# Patient Record
Sex: Female | Born: 1980 | Race: White | Hispanic: No | Marital: Married | Smoking: Never smoker
Health system: Southern US, Community
[De-identification: ages and names within clinical notes are randomized; demographics above are authoritative.]

## PROBLEM LIST (undated history)

## (undated) DIAGNOSIS — Z1509 Genetic susceptibility to other malignant neoplasm: Secondary | ICD-10-CM

## (undated) HISTORY — PX: BLADDER SURGERY: SHX569

## (undated) HISTORY — PX: ABDOMINAL HYSTERECTOMY: SHX81

## (undated) HISTORY — DX: Genetic susceptibility to other malignant neoplasm: Z15.09

---
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
## 2009-02-12 ENCOUNTER — Ambulatory Visit: Payer: Self-pay | Admitting: Radiology

## 2009-02-12 ENCOUNTER — Emergency Department (HOSPITAL_BASED_OUTPATIENT_CLINIC_OR_DEPARTMENT_OTHER): Admission: EM | Admit: 2009-02-12 | Discharge: 2009-02-12 | Payer: Self-pay | Admitting: Emergency Medicine

## 2010-11-20 ENCOUNTER — Ambulatory Visit (INDEPENDENT_AMBULATORY_CARE_PROVIDER_SITE_OTHER): Payer: Self-pay | Admitting: Family Medicine

## 2010-11-20 ENCOUNTER — Encounter: Payer: Self-pay | Admitting: Family Medicine

## 2010-11-20 DIAGNOSIS — R5381 Other malaise: Secondary | ICD-10-CM

## 2010-11-20 DIAGNOSIS — R5383 Other fatigue: Secondary | ICD-10-CM | POA: Insufficient documentation

## 2010-11-20 DIAGNOSIS — IMO0001 Reserved for inherently not codable concepts without codable children: Secondary | ICD-10-CM | POA: Insufficient documentation

## 2010-11-20 DIAGNOSIS — M79609 Pain in unspecified limb: Secondary | ICD-10-CM

## 2010-11-20 DIAGNOSIS — I7300 Raynaud's syndrome without gangrene: Secondary | ICD-10-CM | POA: Insufficient documentation

## 2010-11-23 ENCOUNTER — Telehealth: Payer: Self-pay | Admitting: Family Medicine

## 2010-11-27 ENCOUNTER — Ambulatory Visit (INDEPENDENT_AMBULATORY_CARE_PROVIDER_SITE_OTHER): Payer: Commercial Indemnity | Admitting: Family Medicine

## 2010-11-27 ENCOUNTER — Encounter: Payer: Self-pay | Admitting: Family Medicine

## 2010-11-27 DIAGNOSIS — J330 Polyp of nasal cavity: Secondary | ICD-10-CM | POA: Insufficient documentation

## 2010-11-27 DIAGNOSIS — IMO0001 Reserved for inherently not codable concepts without codable children: Secondary | ICD-10-CM

## 2010-11-28 NOTE — Letter (Signed)
Summary: Intake Forms  Intake Forms   Imported By: Lanelle Bal 11/23/2010 10:54:37  _____________________________________________________________________  External Attachment:    Type:   Image     Comment:   External Document

## 2010-11-28 NOTE — Assessment & Plan Note (Signed)
Summary: NOV: Hands, fatigue, myalgias   Vital Signs:  Patient profile:   30 year old female Height:      67.5 inches Weight:      202 pounds Pulse rate:   69 / minute BP sitting:   115 / 79  (right arm) Cuff size:   regular  Vitals Entered By: Avon Gully CMA, Duncan Dull) (November 20, 2010 10:28 AM) CC: NP-et care-concerned about possible reynauds syndrome or lupus   CC:  NP-et care-concerned about possible reynauds syndrome or lupus.  History of Present Illness: NP-et care-concerned about possible reynauds syndrome or lupus.   Says has alwasy problems with her legs feeling numb and tingling since she was a kid abut feels it is getting worse. ABout 5 months ago noticed her hands and feet occ turn purple. WAs on phentermine on and off for awhile. Has been off this for a month.  Feels like she ache all the time and she is tired all the time.  Aches when it rains.  Doesn't sleep well. Says the bodyaching keeps he awake.  When feel turn purple it is painful so will geting the bath. HAs tested neg for diabetes.  NOtices hands and feet change color when gets cold or when stressed.  Mostly upper back and legs will get achey.  Works 60-80 hours per week.    NO skin changes or hairloss. No SOB.    No inc thirst or urinating alot. Occ legs go numb and will fall.  Uusaly when happens one leg will go numb at a time.  Veryl Speak will last a few minutes.  Has been takign a freinds muscle relaxer at betime.   Says massagees are painful and sometimes doesn't like  to be touched.    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  Married to [employer] and 4 kids.   Never Smoked Alcohol use-no Drug use-no Regular exercise-yes Smoking Status:  never Does Patient Exercise:  yes STD Risk:  never Drug Use:  no Seat Belt Use:  always  Review of Systems       No fever/sweats/weakness, unexplained weight loss/gain.  No vison changes.  No difficulty hearing/ringing in ears, hay fever/allergies.  No chest pain/discomfort, palpitations.  No Br lump/nipple discharge.  No cough/wheeze.  No blood in BM, nausea/vomiting/diarrhea.  No nighttime urination, leaking urine, unusual vaginal bleeding, discharge (penis or vagina).  +  muscle/joint pain. No rash, change in mole.  No HA, memory loss.  No anxiety, sleep d/o, depression.  No easy bruising/bleeding, unexplained lump   Physical Exam  General:  Well-developed,well-nourished,in no acute distress; alert,appropriate and cooperative throughout examination Head:  Normocephalic and atraumatic without obvious abnormalities. No apparent alopecia or balding. Neck:  No deformities, masses, or tenderness noted. Mild thyromegaly.  Lungs:  Normal respiratory effort, chest expands symmetrically. Lungs are clear to auscultation, no crackles or wheezes. Heart:  Normal rate and regular rhythm. S1 and S2 normal without gallop, murmur, click, rub or other extra  sounds. nO carotid or abdomina bruits.  Abdomen:  Bowel sounds positive,abdomen soft and non-tender without masses, organomegaly or hernias noted. Pulses:  RAdial and dorsal pedal upulses 2+ bilat.  Extremities:  NO LE edema.  Neurologic:  alert & oriented X3, gait normal, and DTRs symmetrical and normal.   Skin:  no rashes.  Good cap refill on her hands.  Toes do appear slighly purple today, no sharp demarcation.  Cervical Nodes:  No lymphadenopathy noted Psych:  Cognition and judgment appear intact. Alert and cooperative with normal attention span and  concentration. No apparent delusions, illusions, hallucinations   Impression & Recommendations:  Problem # 1:  HAND PAIN (ICD-729.5) Likely raynauds based on her hx. Will certianly check an ANA thought I don't think she has lupus. Will check CBC and sed rate as well.  Discussed staying warm, wearing gloves, etc to keep the extremities warm.   Problem # 2:  FATIGUE (ICD-780.79) Certainly she works long hours, has 4 kids and is exhausted from not sleeping well. She has alot of reasons to be fatigued.  Will try to rule out thyroid, anemia, and some deficiencies. Thought I expect her lifestyle has more to do wtih her fatigued. Also consider mood and depression.   Orders: T-Comprehensive Metabolic Panel [id number]) T-TSH [id number]) T-ANA [phone]) T-Vitamin B12 [phone]) T-Vitamin D (25-Hydroxy) [id number]) T-Sed Rate (Automated) [phone]) T-Urinalysis (81003-65000) T-CBC No Diff ([id number])  Problem # 3:  MYALGIA (ICD-729.1) Mostly in her back and her legs.  Will check a CK level.   Also consider fibromyalgia as well.  She feelt the aches also keep her awake and make it difficulty for her to fall asleep.  Will check Vt D level as well.  Orders: T-Vitamin D (25-Hydroxy) [id number]) T-CK Total [id number]  Patient Instructions: 1)  Valerian root  capsules in the evening, about an hour before you go to bed.  2)  Please schedule a follow-up appointment in 1-2 weeks to follow up and adress lab results. .    Orders Added: 1)  T-Comprehensive Metabolic Panel [[id number]] 2)  T-TSH [[id number]] 3)  T-ANA [[id number]] 4)  T-Vitamin B12 [82607-23330] 5)  T-Vitamin D (25-Hydroxy) [61607-37106] 6)  T-Sed Rate (Automated) [26948-54627] 7)  T-Urinalysis [81003-65000] 8)  T-CBC No Diff [[id number]] 9)  T-CK Total [[id number]] 10)  New Patient Level IV [03500]

## 2010-11-30 ENCOUNTER — Encounter: Payer: Self-pay | Admitting: Family Medicine

## 2010-12-07 NOTE — Assessment & Plan Note (Signed)
Summary: 1 week fu fatigue, labs.    Vital Signs:  Patient profile:   29 year old female Height:      67.5 inches Weight:      204 pounds BMI:     31.59 Pulse rate:   83 / minute BP sitting:   113 / 78  (right arm) Cuff size:   regular  Vitals Entered By: Avon Gully CMA, Duncan Dull) (November 27, 2010 10:21 AM) CC: discuss labs   CC:  discuss labs.  History of Present Illness: Her to f/u her labs.    Also still c/o of sever aching in her muscles in her UE and LE especially in the evening. this makes it very difficulty for her to fall alseep. SHe says it is painful for her husband to touch her. She feels it is worse at night and then disrupts her sleep.   Current Medications (verified): 1)  None  Allergies (verified): No Known Drug Allergies  Comments:  Nurse/Medical Assistant: The patient's medications and allergies were reviewed with the patient and were updated in the Medication and Allergy Lists. Avon Gully CMA, Duncan Dull) (November 27, 2010 10:22 AM)  Physical Exam  General:  Well-developed,well-nourished,in no acute distress; alert,appropriate and cooperative throughout examination Lungs:  Normal respiratory effort, chest expands symmetrically. Lungs are clear to auscultation, no crackles or wheezes. Heart:  Normal rate and regular rhythm. S1 and S2 normal without gallop, murmur, click, rub or other extra sounds. Msk:  Tender of the left occipu, left shoulder. left forearm. right hip and right thick and both calves. Tender over the left lower back.     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 Discussed we can consider trial of a calcium channel blocker and see if reliever her sxs. She wants to think about it.    Problem # 2:  MYALGIA (ICD-729.1)  Will refer for further ev aluation for fibromyalgia.  Started walking program in the last week.  Will start  low dose muscle relaxer at bedtime.  F/U in 6 weeks.   Her updated medication list for this problem includes:    Flexeril 5 Mg Tabs (Cyclobenzaprine hcl) .Marland Kitchen... 1/4 to 1 tab by mouth in the evening for muscle pain  Orders: Rheumatology Referral (Rheumatology)  Complete Medication List: 1)  Flexeril 5 Mg Tabs (Cyclobenzaprine hcl) .... 1/4 to 1 tab by mouth in the evening for muscle pain  Other Orders: ENT Referral (ENT)  Patient Instructions: 1)  Vitamin D 2000IU once a day for 2 months, then decrease to 1000iu a day 2)  Start Vitamin B12 once a day for energy 3)  We will call you with the referral to rheumatology.  4)  Follow up in 6 weeks.  Prescriptions: FLEXERIL 5 MG TABS (CYCLOBENZAPRINE HCL) 1/4 to 1 tab by mouth in the evening for muscle pain  #30 x 0   Entered and Authorized by:   Nani Gasser MD   Signed by:   Nani Gasser MD on 11/27/2010   Method used:   Electronically to        [facility] # [id number]* (retail)       [address] [place], [place]  [address]       Ph: [phone]       Fax: [phone]   RxID:  [id number]    Orders Added: 1)  Rheumatology Referral [Rheumatology] 2)  ENT Referral [ENT] 3)  Est. Patient Level III [21308]

## 2010-12-07 NOTE — Letter (Signed)
Summary: Primary Care Consult Scheduled Letter  [facility]  [address]   [place], [place] [address]   Phone: [phone]  Fax: [phone]      11/30/2010 MRN: [id number]  Sarah Lynn [address] [facility] [address] [place], [place]  [address]  [place]    Dear Ms. Springston,      We have scheduled an appointment for you.  At the recommendation of Dr.Captola Teschner, we have scheduled you a consult with Dr. Carolyne Fiscal with [facility] on 12/21/10 at 3:00.  Their address is [address] [place]. The office phone number is [phone].  If this appointment day and time is not convenient for you, please feel free to call the office of the doctor you are being referred to at the number listed above and reschedule the appointment.        Thank you,  Patient Care Coordinator [place] [facility]

## 2010-12-07 NOTE — Progress Notes (Signed)
Summary: Lab results  Phone Note Outgoing Call   Summary of Call: Call pt: Blood salt, kidney and liver are normal.  There was a little blood in the urine.  Call [facility] and see if they can run a culture on the urine.Thyroid nl.  Vit D is low. Start 2000IU daily for 8 weeks adn then drop to 1000iu daily.  Sed rate was normal. Muscle enzymes was normal. Vit B12 was nl but on the low end so recommend start once daily vit b12 as well. ANA is neg.  Initial call taken by: Nani Gasser MD,  November 23, 2010 12:09 PM

## 2010-12-08 ENCOUNTER — Encounter: Payer: Self-pay | Admitting: Family Medicine

## 2011-01-07 ENCOUNTER — Encounter: Payer: Self-pay | Admitting: Family Medicine

## 2011-01-08 ENCOUNTER — Encounter: Payer: Self-pay | Admitting: Family Medicine

## 2011-01-08 ENCOUNTER — Ambulatory Visit (INDEPENDENT_AMBULATORY_CARE_PROVIDER_SITE_OTHER): Payer: Commercial Indemnity | Admitting: Family Medicine

## 2011-01-08 VITALS — BP 109/74 | HR 65 | Ht 68.0 in | Wt 213.0 lb

## 2011-01-08 DIAGNOSIS — M791 Myalgia, unspecified site: Secondary | ICD-10-CM

## 2011-01-08 DIAGNOSIS — IMO0001 Reserved for inherently not codable concepts without codable children: Secondary | ICD-10-CM

## 2011-01-08 DIAGNOSIS — E66811 Obesity, class 1: Secondary | ICD-10-CM

## 2011-01-08 DIAGNOSIS — F909 Attention-deficit hyperactivity disorder, unspecified type: Secondary | ICD-10-CM

## 2011-01-08 DIAGNOSIS — E669 Obesity, unspecified: Secondary | ICD-10-CM

## 2011-01-08 MED ORDER — AMPHETAMINE-DEXTROAMPHET ER 20 MG PO CP24: 20.0000 mg | ORAL_CAPSULE | ORAL | Status: DC

## 2011-01-08 MED ORDER — CYCLOBENZAPRINE HCL 5 MG PO TABS: 5.0000 mg | ORAL_TABLET | Freq: Every day | ORAL | Status: DC

## 2011-01-08 NOTE — Patient Instructions (Signed)
We will call you with the rhuem referral.

## 2011-01-08 NOTE — Progress Notes (Signed)
  Subjective:    Patient ID: Sarah Lynn, female    DOB: [birth year], 30 y.o.   MRN: [id number]  HPI Was dx with ADHD in elementary school.  Says tooks meds until graduated from school. Took ritalin and Adderall. Says the adderall worked better. As a child had se with the ritalin.  She hasn't taken meds since she was an adult.  Having a really hard time focusing.  Says takes hours to address her work emails. Very distracted. Difficulty completing tasks. 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     Myalgias - She does feel the muscle relaxer is helping and is due for a refill.  She never heard back about the rheum referral but didn get her ENT apt. She has alos gained some weight and feels this makes her aches and pains worse.  She knows she craves sugar and carbs and has been eating because she works from home. Says she feels like she has to eat whatever her kids don't finish for dinner because she was taught not to waste food. She admits she really eats sweets when she is stressed.  No known heart problems.    Review of Systems     Objective:   Physical Exam  Constitutional: She is oriented to person, place, and time. She appears well-developed and well-nourished.  HENT:  Head: Normocephalic and atraumatic.  Eyes: Pupils are equal, round, and reactive to light.  Cardiovascular: Normal rate, regular rhythm and normal heart sounds.   Pulmonary/Chest: Effort normal and breath sounds normal.  Neurological: She is alert and oriented to person, place, and time.  Skin: Skin is warm and dry.  Psychiatric: She has a normal mood and affect.          Assessment & Plan:

## 2011-01-08 NOTE — Assessment & Plan Note (Signed)
Will retry long acting adderall. Discussed potential SE. Stop if any CP. Then f/u in 1 month for dose adjustment. Consider vyvanse if adderall not helping.

## 2011-01-08 NOTE — Assessment & Plan Note (Signed)
Will re-initial a rheum referral..  Will refill muscle relaxer for now.

## 2011-01-08 NOTE — Assessment & Plan Note (Signed)
WE discussed strategies to help her loose weight and avoid stress eating. Discussed putting healthier options in her home and not "cleaning her kids plates" just because they havent' finished their food. Will see if any better as we treat her ADHD.

## 2011-02-07 ENCOUNTER — Encounter: Payer: Self-pay | Admitting: Family Medicine

## 2011-02-07 ENCOUNTER — Ambulatory Visit (INDEPENDENT_AMBULATORY_CARE_PROVIDER_SITE_OTHER): Payer: Commercial Indemnity | Admitting: Family Medicine

## 2011-02-07 DIAGNOSIS — IMO0001 Reserved for inherently not codable concepts without codable children: Secondary | ICD-10-CM

## 2011-02-07 DIAGNOSIS — Z3009 Encounter for other general counseling and advice on contraception: Secondary | ICD-10-CM

## 2011-02-07 DIAGNOSIS — F909 Attention-deficit hyperactivity disorder, unspecified type: Secondary | ICD-10-CM

## 2011-02-07 MED ORDER — AMPHETAMINE-DEXTROAMPHET ER 20 MG PO CP24: 20.0000 mg | ORAL_CAPSULE | ORAL | Status: DC

## 2011-02-07 MED ORDER — ETONOGESTREL-ETHINYL ESTRADIOL 0.12-0.015 MG/24HR VA RING: VAGINAL_RING | VAGINAL | Status: DC

## 2011-02-07 NOTE — Assessment & Plan Note (Signed)
She is doing really well on her current regimen.  Will refill on current dose of medication. F/U in 4 months.  No SE.

## 2011-02-07 NOTE — Assessment & Plan Note (Signed)
She is doing so much better. Only 2 flares since her last OV. I think the exercise, weight loss, and stopping chocolate has really helped her.

## 2011-02-07 NOTE — Progress Notes (Signed)
  Subjective:    Patient ID: Sarah Lynn, female    DOB: 10/18/1980, 30 y.o.   MRN: [id number]  HPI  She is doing well overall. She has been running for exercise.  She stopped eating chocolate.  She says this has actually helped her fibromyalgia.  Sleeping better.  She is getting divorced.She thinks this is a good thing.   She has lost 16 lbs.  She only needed ot use her flexeril twice this month.  Racing thoughts are much better at night. She says the Adderall is tolerated well. Can feel it wearing off around 5PM. No affecting her slepe. No CP, palps, or SOB.   She would like to discuss irth control. She is worried about potential weight gain. Was on pills years ago and felt it affected her mood..  Review of Systems     Objective:   Physical Exam  Constitutional: She is oriented to person, place, and time. She appears well-developed and well-nourished.  HENT:  Head: Normocephalic and atraumatic.  Cardiovascular: Normal rate, regular rhythm and normal heart sounds.   Pulmonary/Chest: Effort normal and breath sounds normal.  Neurological: She is alert and oriented to person, place, and time.  Skin: Skin is warm and dry.  Psychiatric: She has a normal mood and affect.          Assessment & Plan:  Contraceptive Counseling - Discussed optoins. She wants something low dose so will start with Nuvaring. Discussed how to use the med and sample given.

## 2011-03-19 ENCOUNTER — Other Ambulatory Visit: Payer: Self-pay | Admitting: Family Medicine

## 2011-03-19 MED ORDER — AMPHETAMINE-DEXTROAMPHET ER 20 MG PO CP24: 20.0000 mg | ORAL_CAPSULE | ORAL | Status: DC

## 2011-03-28 ENCOUNTER — Other Ambulatory Visit: Payer: Self-pay | Admitting: Family Medicine

## 2011-03-28 NOTE — Telephone Encounter (Signed)
Pt called and wants to start prozac.  Talked about it at last office visit but because she was started on ADD med it was decided not to start two meds at once.  Please advise.  Trouble with depression due to going through a divorce. Jarvis Newcomer, LPN Domingo Dimes

## 2011-03-29 ENCOUNTER — Other Ambulatory Visit: Payer: Self-pay | Admitting: Family Medicine

## 2011-03-29 MED ORDER — FLUOXETINE HCL 20 MG PO TABS: ORAL_TABLET | ORAL | Status: DC

## 2011-03-29 NOTE — Telephone Encounter (Signed)
Closed

## 2011-03-29 NOTE — Telephone Encounter (Signed)
Pt informed that her script was sent to the pharm and to schedule office visit in 3 weeks if not already done so.  LMOM . Jarvis Newcomer, LPN Domingo Dimes

## 2011-03-29 NOTE — Telephone Encounter (Signed)
Will send to Dr Linford Arnold to address since I am not comfortable starting an antidepressant over the phone on a pt that I've never seen.

## 2011-03-29 NOTE — Telephone Encounter (Signed)
I will send her prescription for Prozac. She needs to follow with me in 3 weeks if she does not already have an appointment.

## 2011-03-29 NOTE — Telephone Encounter (Signed)
Routed message to Dr. Linford Arnold.  Sent originally to Dr. Cathey Endow in error. Jarvis Newcomer, LPN Domingo Dimes

## 2011-04-25 ENCOUNTER — Other Ambulatory Visit: Payer: Self-pay | Admitting: *Deleted

## 2011-04-25 MED ORDER — AMPHETAMINE-DEXTROAMPHET ER 20 MG PO CP24: 20.0000 mg | ORAL_CAPSULE | ORAL | Status: DC

## 2011-05-01 ENCOUNTER — Encounter: Payer: Self-pay | Admitting: Family Medicine

## 2011-05-08 ENCOUNTER — Ambulatory Visit: Payer: Commercial Indemnity | Admitting: Family Medicine

## 2011-05-08 DIAGNOSIS — Z029 Encounter for administrative examinations, unspecified: Secondary | ICD-10-CM

## 2011-06-05 ENCOUNTER — Encounter: Payer: Self-pay | Admitting: Family Medicine

## 2011-06-05 ENCOUNTER — Ambulatory Visit (INDEPENDENT_AMBULATORY_CARE_PROVIDER_SITE_OTHER): Payer: Commercial Indemnity | Admitting: Family Medicine

## 2011-06-05 DIAGNOSIS — IMO0001 Reserved for inherently not codable concepts without codable children: Secondary | ICD-10-CM

## 2011-06-05 DIAGNOSIS — F909 Attention-deficit hyperactivity disorder, unspecified type: Secondary | ICD-10-CM

## 2011-06-05 DIAGNOSIS — D699 Hemorrhagic condition, unspecified: Secondary | ICD-10-CM

## 2011-06-05 MED ORDER — AMPHETAMINE-DEXTROAMPHET ER 30 MG PO CP24: 30.0000 mg | ORAL_CAPSULE | ORAL | Status: DC

## 2011-06-05 MED ORDER — METAXALONE 800 MG PO TABS: 800.0000 mg | ORAL_TABLET | ORAL | Status: DC

## 2011-06-05 MED ORDER — MELOXICAM 7.5 MG PO TABS: 7.5000 mg | ORAL_TABLET | Freq: Every day | ORAL | Status: DC

## 2011-06-05 NOTE — Patient Instructions (Addendum)
Return in 1 month.  Try the skelaxin first at night only.  Use the mobic daily but do not use w/naprosyn or motrin Aderall as per discussion dosage increased

## 2011-06-05 NOTE — Progress Notes (Signed)
  Subjective:    Patient ID: Sarah Lynn, female    DOB: 1981/04/27, 30 y.o.   MRN: [id number]  HPI She is here for follow up. She declines flu vaccine. States that she took flexeril for myalgia pain but 5 mg at night puts her out.   Review of Systems  Musculoskeletal: Positive for myalgias and joint swelling.       Has been told she mightb have fibromyalgia  Neurological:       Increase trouble focusing. He states when she was first put on the aderall she could skip weekend and some week days but now she feels that daily now is not doing the job  Hematological: Bruises/bleeds easily.       Has always seemed to bruise easy but has noticed a lot more bruising and denies any signidficant trauma  Psychiatric/Behavioral: Positive for decreased concentration.       Objective:   Physical Exam  Constitutional: She is oriented to person, place, and time. She appears well-developed and well-nourished. No distress.  Neck: Normal range of motion. Neck supple.  Cardiovascular: Normal rate and regular rhythm.   Neurological: She is alert and oriented to person, place, and time.  Skin: Skin is warm and dry. She is not diaphoretic.       Multiple bruises on both thighs.          Assessment & Plan:  Patient will have blood work CBC PT& PTT to make sure increase brusing is not significant ADHD per her report that it has gotten worse will increase her adderall to 30mg  Myalgia explained we may need referal bu for now will try skelaxin mostly at night and mobic 7.5mg  po a day. Return in 1 month.  Try the skelaxin first at night only.  Use the mobic daily but do not use w/naprosyn or motrin Aderall as per discussion dosage increased

## 2011-07-06 ENCOUNTER — Encounter: Payer: Self-pay | Admitting: Family Medicine

## 2011-07-06 ENCOUNTER — Ambulatory Visit (INDEPENDENT_AMBULATORY_CARE_PROVIDER_SITE_OTHER): Payer: Commercial Indemnity | Admitting: Family Medicine

## 2011-07-06 VITALS — BP 109/75 | HR 86 | Ht 69.0 in | Wt 184.0 lb

## 2011-07-06 DIAGNOSIS — IMO0001 Reserved for inherently not codable concepts without codable children: Secondary | ICD-10-CM

## 2011-07-06 DIAGNOSIS — F909 Attention-deficit hyperactivity disorder, unspecified type: Secondary | ICD-10-CM

## 2011-07-06 MED ORDER — MELOXICAM 7.5 MG PO TABS: 7.5000 mg | ORAL_TABLET | Freq: Two times a day (BID) | ORAL | Status: DC | PRN

## 2011-07-06 MED ORDER — AMPHETAMINE-DEXTROAMPHET ER 30 MG PO CP24: 30.0000 mg | ORAL_CAPSULE | ORAL | Status: DC

## 2011-07-06 NOTE — Progress Notes (Signed)
  Subjective:    Patient ID: Sarah Lynn, female    DOB: [birth year], 30 y.o.   MRN: [id number]  HPI ADHD  - no CP or SOB.  Doing well on the higher dose.  Says she is eatig better. No tears or shakiness. She really feels like it is helping her focus. 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 She is not having any problems with insomnia her sleep.  Myalgias - Says didn't try the msucle relaxer because it scared her.  She has been taking the mobic and says it really helps her and wants to know if can take a 2nd dose at betime. She denies any GI irritation from the Mobic. No blood in the stools. No reflux symptoms.  Review of Systems     Objective:   Physical Exam  Constitutional: She is oriented to person, place, and time. She appears well-developed and well-nourished.  HENT:  Head: Normocephalic and atraumatic.  Cardiovascular: Normal rate, regular rhythm and normal heart sounds.   Pulmonary/Chest: Effort normal and breath sounds normal.  Neurological: She is alert and oriented to person, place, and time.  Skin: Skin is warm and dry.  Psychiatric: She has a normal mood and affect.          Assessment & Plan:  ADHD-she is doing very well on the 30 mg SR Adderall. She is given a prescription today. Followup in about 3 months. She can call for her refill in one month and come by and pick up the prescription. I remind her that this is scheduled drugs and be very careful with it.  Myalgias-she seems to be doing much better on the med it. I did increase her dose to twice a day as needed. I strongly warned her about potential for GI side effects and to monitor for any discomfort, irritation, reflux symptoms, or blood in the stool. She's to make sure to take it with food and water. Followup 3 months.

## 2011-08-13 ENCOUNTER — Other Ambulatory Visit: Payer: Self-pay | Admitting: *Deleted

## 2011-08-13 ENCOUNTER — Telehealth: Payer: Self-pay | Admitting: *Deleted

## 2011-08-13 DIAGNOSIS — F909 Attention-deficit hyperactivity disorder, unspecified type: Secondary | ICD-10-CM

## 2011-08-13 MED ORDER — AMPHETAMINE-DEXTROAMPHET ER 30 MG PO CP24: 30.0000 mg | ORAL_CAPSULE | ORAL | Status: DC

## 2011-09-19 ENCOUNTER — Other Ambulatory Visit: Payer: Self-pay | Admitting: *Deleted

## 2011-09-19 DIAGNOSIS — F909 Attention-deficit hyperactivity disorder, unspecified type: Secondary | ICD-10-CM

## 2011-09-19 MED ORDER — AMPHETAMINE-DEXTROAMPHET ER 30 MG PO CP24: 30.0000 mg | ORAL_CAPSULE | ORAL | Status: DC

## 2011-11-02 ENCOUNTER — Other Ambulatory Visit: Payer: Self-pay | Admitting: *Deleted

## 2011-11-02 DIAGNOSIS — F909 Attention-deficit hyperactivity disorder, unspecified type: Secondary | ICD-10-CM

## 2011-11-02 MED ORDER — AMPHETAMINE-DEXTROAMPHET ER 30 MG PO CP24: 30.0000 mg | ORAL_CAPSULE | ORAL | Status: DC

## 2011-11-06 ENCOUNTER — Ambulatory Visit (INDEPENDENT_AMBULATORY_CARE_PROVIDER_SITE_OTHER): Payer: Commercial Indemnity | Admitting: Family Medicine

## 2011-11-06 ENCOUNTER — Other Ambulatory Visit: Payer: Self-pay | Admitting: Family Medicine

## 2011-11-06 ENCOUNTER — Encounter: Payer: Self-pay | Admitting: Family Medicine

## 2011-11-06 VITALS — BP 121/85 | HR 90 | Ht 68.0 in | Wt 175.0 lb

## 2011-11-06 DIAGNOSIS — F430 Acute stress reaction: Secondary | ICD-10-CM

## 2011-11-06 DIAGNOSIS — N644 Mastodynia: Secondary | ICD-10-CM

## 2011-11-06 DIAGNOSIS — F438 Other reactions to severe stress: Secondary | ICD-10-CM

## 2011-11-06 DIAGNOSIS — R110 Nausea: Secondary | ICD-10-CM

## 2011-11-06 DIAGNOSIS — N760 Acute vaginitis: Secondary | ICD-10-CM

## 2011-11-06 NOTE — Progress Notes (Signed)
  Subjective:    Patient ID: Sarah Lynn, female    DOB: 07/29/1981, 31 y.o.   MRN: [id number]  HPI  She is here today because she is worried she could be pregnant. She split from her husband I missed a year ago. She currently has a new sexual partner. He had a vasectomy per his report but she has noticed breast tenderness and fatigue. She also notes that her anxiety and stress levels are very high right now. She has taken Prozac in the past and did well with this. Though she felt it increased her sex drive. She has also noticed a vaginal discharge in the last few days. No itching or irritation.  She says that she feels her breasts have been tingly. She also feels a bit more swollen. She denies any lumps or changes in skin color. She does still currently overwhelmed. Her next period is actually not due to next week. Note, she recently found out that her new sex partner is actually married. He was not being truthful with her. Now she wonders if he was lying about a vasectomy.  Review of Systems     Objective:   Physical Exam  Constitutional: She is oriented to person, place, and time. She appears well-developed and well-nourished.  HENT:  Head: Normocephalic and atraumatic.  Neck: Neck supple. No thyromegaly present.  Cardiovascular: Normal rate, regular rhythm and normal heart sounds.   Pulmonary/Chest: Effort normal and breath sounds normal.  Lymphadenopathy:    She has no cervical adenopathy.  Neurological: She is alert and oriented to person, place, and time.  Skin: Skin is warm and dry.  Psychiatric: She has a normal mood and affect. Her behavior is normal.          Assessment & Plan:  Breast tenderness-this certainly could be hormonal related. She's not due for her period in time next week. For now we'll check a TSH, CBC to rule out other causes. If her blood work is completely normal then I recommend that she give this another week to see if she starts her period and if her  symptoms resolve.  Vaginitis-we will send for wet prep to evaluate for BV and yeast infection. Also consider STD testing.  Acute situational stress-I recommended that we restart the fluoxetine should see [place] and wellness in the past. We will restart a low dose and I will see her back in 3-4 weeks to make sure that she is doing well and improving.  Fatigue-I will rule out anemia and thyroid problems though I suspect this is related to her stress.

## 2011-11-07 ENCOUNTER — Other Ambulatory Visit: Payer: Self-pay | Admitting: Family Medicine

## 2011-11-07 LAB — WET PREP, GENITAL

## 2011-11-07 MED ORDER — METRONIDAZOLE 500 MG PO TABS: 500.0000 mg | ORAL_TABLET | Freq: Two times a day (BID) | ORAL | Status: AC

## 2011-11-07 MED ORDER — FLUOXETINE HCL 10 MG PO CAPS: ORAL_CAPSULE | ORAL | Status: DC

## 2011-11-07 NOTE — Progress Notes (Signed)
Addended by: Nani Gasser D on: 11/07/2011 08:28 AM   Modules accepted: Orders

## 2011-11-12 ENCOUNTER — Encounter: Payer: Self-pay | Admitting: *Deleted

## 2011-12-06 ENCOUNTER — Telehealth: Payer: Self-pay | Admitting: Family Medicine

## 2011-12-06 DIAGNOSIS — F909 Attention-deficit hyperactivity disorder, unspecified type: Secondary | ICD-10-CM

## 2011-12-06 MED ORDER — AMPHETAMINE-DEXTROAMPHET ER 30 MG PO CP24: 30.0000 mg | ORAL_CAPSULE | ORAL | Status: DC

## 2011-12-06 NOTE — Telephone Encounter (Signed)
Rx printed for MD to sign.  °

## 2011-12-06 NOTE — Telephone Encounter (Signed)
Patient walk-in request to get a refill script of Adderall today because she is completely out. She said she will come back later today to pick up.

## 2012-01-21 ENCOUNTER — Other Ambulatory Visit: Payer: Self-pay | Admitting: *Deleted

## 2012-01-21 DIAGNOSIS — F909 Attention-deficit hyperactivity disorder, unspecified type: Secondary | ICD-10-CM

## 2012-01-21 MED ORDER — AMPHETAMINE-DEXTROAMPHET ER 30 MG PO CP24: 30.0000 mg | ORAL_CAPSULE | ORAL | Status: DC

## 2012-02-26 ENCOUNTER — Other Ambulatory Visit: Payer: Self-pay | Admitting: *Deleted

## 2012-02-26 DIAGNOSIS — F909 Attention-deficit hyperactivity disorder, unspecified type: Secondary | ICD-10-CM

## 2012-02-26 MED ORDER — AMPHETAMINE-DEXTROAMPHET ER 30 MG PO CP24: 30.0000 mg | ORAL_CAPSULE | ORAL | Status: DC

## 2012-04-01 ENCOUNTER — Other Ambulatory Visit: Payer: Self-pay | Admitting: *Deleted

## 2012-04-01 DIAGNOSIS — F909 Attention-deficit hyperactivity disorder, unspecified type: Secondary | ICD-10-CM

## 2012-04-01 MED ORDER — AMPHETAMINE-DEXTROAMPHET ER 30 MG PO CP24: 30.0000 mg | ORAL_CAPSULE | ORAL | Status: DC

## 2012-04-01 NOTE — Telephone Encounter (Signed)
rx refill

## 2012-05-05 ENCOUNTER — Ambulatory Visit (INDEPENDENT_AMBULATORY_CARE_PROVIDER_SITE_OTHER): Payer: Commercial Indemnity | Admitting: Physician Assistant

## 2012-05-05 ENCOUNTER — Encounter: Payer: Self-pay | Admitting: Physician Assistant

## 2012-05-05 VITALS — BP 124/81 | HR 110 | Ht 68.0 in | Wt 193.0 lb

## 2012-05-05 DIAGNOSIS — F32A Depression, unspecified: Secondary | ICD-10-CM

## 2012-05-05 DIAGNOSIS — F329 Major depressive disorder, single episode, unspecified: Secondary | ICD-10-CM

## 2012-05-05 DIAGNOSIS — IMO0001 Reserved for inherently not codable concepts without codable children: Secondary | ICD-10-CM

## 2012-05-05 DIAGNOSIS — F909 Attention-deficit hyperactivity disorder, unspecified type: Secondary | ICD-10-CM

## 2012-05-05 DIAGNOSIS — F341 Dysthymic disorder: Secondary | ICD-10-CM

## 2012-05-05 MED ORDER — MELOXICAM 7.5 MG PO TABS: 7.5000 mg | ORAL_TABLET | Freq: Two times a day (BID) | ORAL | Status: AC | PRN

## 2012-05-05 MED ORDER — AMPHETAMINE-DEXTROAMPHET ER 30 MG PO CP24: 30.0000 mg | ORAL_CAPSULE | ORAL | Status: DC

## 2012-05-05 NOTE — Patient Instructions (Addendum)
Start 1 tab every day. Call if not side effects and no symptoms. Follow up in 4-6 weeks.

## 2012-05-05 NOTE — Progress Notes (Signed)
Subjective:    Patient ID: Sarah Lynn, female    DOB: 04-17-1981, 31 y.o.   MRN: [id number]  HPI Patient presents to the clinic today with a chief compliant of muscle pain all over that has worsened in the last two weeks. She reports that she has been told in the past that she has fibromyalgia. Her muscle pain is on and off. She can have it for 2-3 days and then it will go away for a week or more. IN the past it has been well controlled taking mobic twice a day when muscle pain starts. The most recent pain has been going on for 6 days and getting worse. She reports that it hurts for her boyfriend to even touch her. She is taking Mobic but it does not seem to be helping very much. She has not tried anything else to make better and it seems that as she gets more stressed the pain gets worse. Last visit Dr. Linford Arnold gave her Prozac which she used for one month but did not continue because she did not like the way it made her feel. Not used in many months. She has felt very stressed and like her anxiety has increased in the past couple of months. She has not been able to sleep well lately anyways. She just feels like she can't shut down at night. She has been diagnosed in the past per patient with Bipolar. She denies that the not sleeping is mania. She states she has had mania before and she doesn't feel out of control and she doesn't crash with depression when she starts to sleep. 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 Been on Adderall for a while and continues to help with focus so that she be functional and get things done.   Family hx has changed. Mother has terminal cervical cancer.    Review of Systems     Objective:   Physical Exam  Constitutional: She is oriented to person, place, and time. She appears well-developed and well-nourished.  HENT:  Head: Normocephalic and atraumatic.  Cardiovascular: Regular rhythm and normal heart sounds.          Tachycardia at 110.   Pulmonary/Chest: Effort normal and breath sounds normal.  Neurological: She is alert and oriented to person, place, and time.  Skin: Skin is warm and dry.  Psychiatric: She has a normal mood and affect. Her behavior is normal.       Her thoughts were coherent and seemed calm.           Assessment & Plan:  ADHD-Refilled Adderall. Will recheck in 1 month with office visit. Recheck pulse was 101. Need to make sure pulse is staying down.    Anxiety and Depression-GAD-7 was 17. PHQ-9 was 11 MDQ was 8/13. Decided to give samples of cymbalta 30mg  to start daily. I am hoping that this will help with both pysch issues and with muscle pain. Pt aware that if she reacts to medication or has any side effects to call office and stop medication. She is also aware that if worsening depression or mood occurs to stop and call office. Follow up in 1 month. We had a brief discussion about formal evaluation to see if she would benefit from mood stablizer for Bipolar. I am not 100 percent that all of her symptoms match up to Bipolar want to see how she does with this medication change.    Myalgia-  Refilled Mobic. Also started Cymbalta to help with pysch issues and pain. Will follow up in 1 month. Encouraged patient to work on relaxation techniques and warm baths.   Pt encouraged to come in for a pap smear. Not had pap in 5 years.

## 2012-06-22 ENCOUNTER — Encounter (HOSPITAL_BASED_OUTPATIENT_CLINIC_OR_DEPARTMENT_OTHER): Payer: Self-pay | Admitting: *Deleted

## 2012-06-22 ENCOUNTER — Emergency Department (HOSPITAL_BASED_OUTPATIENT_CLINIC_OR_DEPARTMENT_OTHER)
Admission: EM | Admit: 2012-06-22 | Discharge: 2012-06-22 | Disposition: A | Discharge status: Home / Self Care | Payer: Managed Care, Other (non HMO) | Attending: Emergency Medicine | Admitting: Emergency Medicine

## 2012-06-22 DIAGNOSIS — R110 Nausea: Secondary | ICD-10-CM | POA: Insufficient documentation

## 2012-06-22 DIAGNOSIS — B9689 Other specified bacterial agents as the cause of diseases classified elsewhere: Secondary | ICD-10-CM | POA: Insufficient documentation

## 2012-06-22 DIAGNOSIS — N760 Acute vaginitis: Secondary | ICD-10-CM | POA: Insufficient documentation

## 2012-06-22 DIAGNOSIS — R300 Dysuria: Secondary | ICD-10-CM | POA: Insufficient documentation

## 2012-06-22 DIAGNOSIS — A499 Bacterial infection, unspecified: Secondary | ICD-10-CM | POA: Insufficient documentation

## 2012-06-22 DIAGNOSIS — R109 Unspecified abdominal pain: Secondary | ICD-10-CM | POA: Insufficient documentation

## 2012-06-22 LAB — CBC
Hemoglobin: 11.5 g/dL — ABNORMAL LOW (ref 12.0–15.0)
MCH: 29.3 pg (ref 26.0–34.0)
MCV: 88.5 fL (ref 78.0–100.0)
RBC: 3.93 MIL/uL (ref 3.87–5.11)

## 2012-06-22 LAB — URINALYSIS, ROUTINE W REFLEX MICROSCOPIC
Hgb urine dipstick: NEGATIVE
Leukocytes, UA: NEGATIVE
Nitrite: NEGATIVE
Protein, ur: NEGATIVE mg/dL
Specific Gravity, Urine: 1.025 (ref 1.005–1.030)
Urobilinogen, UA: 1 mg/dL (ref 0.0–1.0)

## 2012-06-22 LAB — PREGNANCY, URINE: Preg Test, Ur: NEGATIVE

## 2012-06-22 LAB — COMPREHENSIVE METABOLIC PANEL
ALT: 21 U/L (ref 0–35)
CO2: 25 mEq/L (ref 19–32)
Calcium: 8.7 mg/dL (ref 8.4–10.5)
GFR calc Af Amer: >90 mL/min (ref >90)
GFR calc non Af Amer: >90 mL/min (ref >90)
Glucose, Bld: 88 mg/dL (ref 70–99)
Sodium: 139 mEq/L (ref 135–145)
Total Bilirubin: 0.2 mg/dL — ABNORMAL LOW (ref 0.3–1.2)

## 2012-06-22 LAB — WET PREP, GENITAL

## 2012-06-22 MED ORDER — METRONIDAZOLE 500 MG PO TABS: 500.0000 mg | ORAL_TABLET | Freq: Two times a day (BID) | ORAL | Status: DC

## 2012-06-22 NOTE — ED Provider Notes (Signed)
History     CSN: [id number]  Arrival date & time 06/22/12  0010   First MD Initiated Contact with Patient 06/22/12 0058      Chief Complaint  Patient presents with  . Abdominal Pain    (Consider location/radiation/quality/duration/timing/severity/associated sxs/prior treatment) HPI Comments: [name] presents ambulatory for evaluation of left flank and lower abdominal pain. She states pain began roughly 2-4 hours ago and a sharp and burning in nature. She reports having mild nausea without vomiting. She denies fever, shortness of breath, chest pain, diarrhea, constipation, abnormal discharges. She does report she has had some intermittent spotting between periods as well as spotting with intercourse. She denies pain with intercourse. She has had some burning or discomfort with urination without pyuria or hematuria. She denies any history of kidney stones and only related past medical history is of 3 previous C-section.  Patient is a 31 y.o. female presenting with abdominal pain. The history is provided by the patient. No language interpreter was used.  Abdominal Pain The primary symptoms of the illness include abdominal pain, nausea and dysuria. The primary symptoms of the illness do not include fever, fatigue, vomiting, diarrhea, vaginal discharge or vaginal bleeding. The current episode started 3 to 5 hours ago. The onset of the illness was gradual. The problem has not changed since onset. The dysuria is associated with urgency. The dysuria is not associated with hematuria, frequency or vaginal pain.   The illness is associated with recent sexual activity. The patient states that she believes she is currently not pregnant. The patient has not had a change in bowel habit. Risk factors for an acute abdominal problem include a history of abdominal surgery. Additional symptoms associated with the illness include urgency and back pain. Symptoms associated with the illness do not include  chills, anorexia, diaphoresis, heartburn, constipation, hematuria or frequency.    History reviewed. No pertinent past medical history.  Past Surgical History  Procedure Date  . Cesarean section     X 3    Family History  Problem Relation Age of Onset  . Depression Mother   . Diabetes Mother   . Cancer Mother     cervical cancer  . Diabetes Other     History  Substance Use Topics  . Smoking status: Never Smoker   . Smokeless tobacco: Not on file  . Alcohol Use: No    OB History    Grav Para Term Preterm Abortions TAB SAB Ect Mult Living                  Review of Systems  Constitutional: Negative for fever, chills, diaphoresis, appetite change and fatigue.  HENT: Negative.   Eyes: Negative.   Respiratory: Negative.   Cardiovascular: Negative.   Gastrointestinal: Positive for nausea and abdominal pain. Negative for heartburn, vomiting, diarrhea, constipation, blood in stool and anorexia.  Genitourinary: Positive for dysuria and urgency. Negative for frequency, hematuria, decreased urine volume, vaginal bleeding, vaginal discharge, vaginal pain and pelvic pain.  Musculoskeletal: Positive for back pain. Negative for myalgias, joint swelling and arthralgias.  Skin: Negative.   Neurological: Negative.   Hematological: Negative.   Psychiatric/Behavioral: Negative.     Allergies  Review of patient's allergies indicates no known allergies.  Home Medications   Current Outpatient Rx  Name Route Sig Dispense Refill  . AMPHETAMINE-DEXTROAMPHET ER 30 MG PO CP24 Oral Take 1 capsule (30 mg total) by mouth every morning. 30 capsule 0  . MELOXICAM 7.5 MG PO TABS Oral  Take 1 tablet (7.5 mg total) by mouth 2 (two) times daily as needed for pain. 60 tablet 3    BP 105/72  Pulse 90  Temp 97.9 F (36.6 C) (Oral)  Resp 20  Ht 5\' 9"  (1.753 m)  Wt 200 lb (90.719 kg)  BMI 29.53 kg/m2  SpO2 100%  Physical Exam  Nursing note and vitals reviewed. Constitutional: She is  oriented to person, place, and time. She appears well-developed and well-nourished. No distress.  HENT:  Head: Normocephalic and atraumatic.  Right Ear: External ear normal.  Left Ear: External ear normal.  Nose: Nose normal.  Mouth/Throat: Oropharynx is clear and moist. No oropharyngeal exudate.  Eyes: Conjunctivae normal are normal. Pupils are equal, round, and reactive to light. Right eye exhibits no discharge. Left eye exhibits no discharge. No scleral icterus.  Neck: Normal range of motion. Neck supple. No JVD present. No tracheal deviation present.  Cardiovascular: Normal rate, regular rhythm, normal heart sounds and intact distal pulses.  Exam reveals no gallop and no friction rub.   No murmur heard. Pulmonary/Chest: Effort normal and breath sounds normal. No stridor. No respiratory distress. She has no wheezes. She has no rales. She exhibits no tenderness.  Abdominal: Soft. Bowel sounds are normal. She exhibits no distension and no mass. There is tenderness (Left flank and left lower quadrant). There is no rebound and no guarding. Hernia confirmed negative in the right inguinal area and confirmed negative in the left inguinal area.  Genitourinary: Uterus normal. Rectal exam shows no external hemorrhoid. No labial fusion. There is no rash, tenderness, lesion or injury on the right labia. There is no rash, tenderness, lesion or injury on the left labia. Uterus is not deviated and not fixed. Cervix exhibits no motion tenderness, no discharge and no friability. Right adnexum displays no mass, no tenderness and no fullness. Left adnexum displays no mass, no tenderness and no fullness. There is tenderness (Vague) around the vagina. No erythema or bleeding around the vagina. No foreign body around the vagina. No signs of injury around the vagina. Vaginal discharge (Scant) found.         Circular area marked on diagram of cervix is a slightly discolored as compared to the surrounding tissue.  No  bleeding or friability noted.    Musculoskeletal: Normal range of motion. She exhibits tenderness (Left CVA tenderness). She exhibits no edema.  Lymphadenopathy:    She has no cervical adenopathy.       Right: No inguinal adenopathy present.       Left: No inguinal adenopathy present.  Neurological: She is alert and oriented to person, place, and time. No cranial nerve deficit.  Skin: Skin is warm and dry. No rash noted. She is not diaphoretic. No erythema. No pallor.  Psychiatric: She has a normal mood and affect. Her behavior is normal.    ED Course  Procedures (including critical care time)   Labs Reviewed  PREGNANCY, URINE  URINALYSIS, ROUTINE W REFLEX MICROSCOPIC  GC/CHLAMYDIA PROBE AMP, GENITAL  WET PREP, GENITAL  CBC  COMPREHENSIVE METABOLIC PANEL   No results found.   No diagnosis found.   Date: 06/22/2012  Rate: 79 bpm  Rhythm: normal sinus rhythm  QRS Axis: normal  Intervals: normal  ST/T Wave abnormalities: normal  Conduction Disutrbances:none  Narrative Interpretation:   Old EKG Reviewed: none available      MDM  Patient presents for evaluation of left flank pain and burning with urination. Symptoms began roughly 2-4 hours ago  and she reports that she is having ongoing pain. Patient is afebrile, not normal vital signs, no acute distress appreciated. Patient has no evidence of peritonitis on exam but does have left-sided CVA tenderness. She also reports some vaginal spotting and discomfort.  Will obtain a pelvic exam, routine belly labs, urinalysis and urine pregnancy test, wet prep and GC chlamydia probe.  0440.  Pt stable, NAD.  Pt c/o burning lower back pain.  She has refused pain medication.  Urinalysis is negative for a UTI.  Neg u-preg.  Note normal BMP and LFTs.  Pt has mild anemia but no bleeding was noted on pelvic exam.  Wet prep is positive for clue cells.  Pt reports she has a mild chronic anemia.  Cannot explain the pain she is feeling across the  back. It is not reproduced on exam.  Discussed indications for return to the emergency department.  Will treat for bacterial vaginosis.        Tobin Chad, MD 06/22/12 [phone]

## 2012-06-22 NOTE — ED Notes (Signed)
MD at bedside. 

## 2012-06-22 NOTE — ED Notes (Signed)
Pt sattes she has been having bleeding with intercourse and between periods. Burning with urination. Two hours ago, began having left flank and LLQ pain along with some pain in her chest. "Feel anxious"

## 2012-06-23 LAB — GC/CHLAMYDIA PROBE AMP, GENITAL: GC Probe Amp, Genital: NEGATIVE

## 2012-06-30 ENCOUNTER — Telehealth: Payer: Self-pay

## 2012-06-30 DIAGNOSIS — F909 Attention-deficit hyperactivity disorder, unspecified type: Secondary | ICD-10-CM

## 2012-06-30 NOTE — Telephone Encounter (Signed)
Patient would like a refill on her Adderall. Per last note she needs to have an office visit. I left message on her voice mail stating this.

## 2012-06-30 NOTE — Telephone Encounter (Signed)
Ok to refill for one more month but must come in within the month. Need to recheck BP and HR.

## 2012-07-01 MED ORDER — AMPHETAMINE-DEXTROAMPHET ER 30 MG PO CP24: 30.0000 mg | ORAL_CAPSULE | ORAL | Status: DC

## 2012-07-01 NOTE — Telephone Encounter (Signed)
Prescription is up front. Patient will need to have her blood pressure checked and pulse. She will also need to make an appointment with J[facility] in 1 month.  No answer/unable to leave message.

## 2015-06-16 ENCOUNTER — Encounter (HOSPITAL_BASED_OUTPATIENT_CLINIC_OR_DEPARTMENT_OTHER): Payer: Self-pay | Admitting: *Deleted

## 2015-06-16 ENCOUNTER — Emergency Department (HOSPITAL_BASED_OUTPATIENT_CLINIC_OR_DEPARTMENT_OTHER)
Admission: EM | Admit: 2015-06-16 | Discharge: 2015-06-16 | Disposition: A | Discharge status: Home / Self Care | Payer: Commercial Indemnity | Attending: Emergency Medicine | Admitting: Emergency Medicine

## 2015-06-16 DIAGNOSIS — N390 Urinary tract infection, site not specified: Secondary | ICD-10-CM

## 2015-06-16 DIAGNOSIS — Z79899 Other long term (current) drug therapy: Secondary | ICD-10-CM | POA: Insufficient documentation

## 2015-06-16 DIAGNOSIS — B372 Candidiasis of skin and nail: Secondary | ICD-10-CM | POA: Insufficient documentation

## 2015-06-16 LAB — URINALYSIS, ROUTINE W REFLEX MICROSCOPIC
GLUCOSE, UA: NEGATIVE mg/dL
HGB URINE DIPSTICK: NEGATIVE
KETONES UR: 15 mg/dL — AB
Nitrite: POSITIVE — AB
PH: 5 (ref 5.0–8.0)
PROTEIN: 30 mg/dL — AB
Specific Gravity, Urine: 1.044 — ABNORMAL HIGH (ref 1.005–1.030)
Urobilinogen, UA: 1 mg/dL (ref 0.0–1.0)

## 2015-06-16 LAB — WET PREP, GENITAL
CLUE CELLS WET PREP: NONE SEEN
TRICH WET PREP: NONE SEEN
Yeast Wet Prep HPF POC: NONE SEEN

## 2015-06-16 LAB — URINE MICROSCOPIC-ADD ON

## 2015-06-16 MED ORDER — CEPHALEXIN 500 MG PO CAPS: 500.0000 mg | ORAL_CAPSULE | Freq: Two times a day (BID) | ORAL | Status: DC

## 2015-06-16 MED ORDER — CLOTRIMAZOLE 1 % EX CREA: TOPICAL_CREAM | CUTANEOUS | Status: DC

## 2015-06-16 NOTE — Discharge Instructions (Signed)

## 2015-06-16 NOTE — ED Notes (Signed)
Left flank pain and hematuria x 2 days.

## 2015-06-16 NOTE — ED Provider Notes (Signed)
CSN: [id number]     Arrival date & time 06/16/15  1604 History   First MD Initiated Contact with Patient 06/16/15 1616     Chief Complaint  Patient presents with  . Hematuria     (Consider location/radiation/quality/duration/timing/severity/associated sxs/prior Treatment) HPI Comments: Patient presents with burning on urination for the last 2 days. Today she noticed some hematuria and developed pain in her left flank. This is been constant since earlier this morning. She denies any nausea or vomiting. She denies any known fevers. She denies any vaginal bleeding or discharge although she feels like her vaginal area swollen. She's been using AZO without relief. She does have a history of kidney stones in the past as well as a prior bladder perforation during an abdominal hysterectomy which was surgically repaired. She currently does not have a PCP or urologist.  Patient is a 34 y.o. female presenting with hematuria.  Hematuria Pertinent negatives include no chest pain, no abdominal pain, no headaches and no shortness of breath.    History reviewed. No pertinent past medical history. Past Surgical History  Procedure Laterality Date  . Cesarean section      X 3  . Bladder surgery    . Abdominal hysterectomy     Family History  Problem Relation Age of Onset  . Depression Mother   . Diabetes Mother   . Cancer Mother     cervical cancer  . Diabetes Other    Social History  Substance Use Topics  . Smoking status: Never Smoker   . Smokeless tobacco: None  . Alcohol Use: No   OB History    No data available     Review of Systems  Constitutional: Negative for fever, chills, diaphoresis and fatigue.  HENT: Negative for congestion, rhinorrhea and sneezing.   Eyes: Negative.   Respiratory: Negative for cough, chest tightness and shortness of breath.   Cardiovascular: Negative for chest pain and leg swelling.  Gastrointestinal: Negative for nausea, vomiting, abdominal pain, diarrhea  and blood in stool.  Genitourinary: Positive for dysuria, hematuria and flank pain. Negative for frequency and difficulty urinating.  Musculoskeletal: Positive for back pain. Negative for arthralgias.  Skin: Negative for rash.  Neurological: Negative for dizziness, speech difficulty, weakness, numbness and headaches.      Allergies  Review of patient's allergies indicates no known allergies.  Home Medications   Prior to Admission medications   Medication Sig Start Date End Date Taking? Authorizing Provider  PHENTERMINE HCL PO Take by mouth.   Yes Historical Provider, MD  amphetamine-dextroamphetamine (ADDERALL XR) 30 MG 24 hr capsule Take 1 capsule (30 mg total) by mouth every morning. 07/01/12 07/01/17  Agapito Games, MD  cephALEXin (KEFLEX) 500 MG capsule Take 1 capsule (500 mg total) by mouth 2 (two) times daily. 06/16/15   Rolan Bucco, MD  clotrimazole (LOTRIMIN) 1 % cream Apply to affected area 2 times daily 06/16/15   Rolan Bucco, MD  metroNIDAZOLE (FLAGYL) 500 MG tablet Take 1 tablet (500 mg total) by mouth 2 (two) times daily. 06/22/12   Tobin Chad, MD   BP 116/82 mmHg  Pulse 88  Temp(Src) 98.1 F (36.7 C) (Oral)  Resp 20  Ht  (1.702 m)  Wt 230 lb (104.327 kg)  BMI 36.01 kg/m2  SpO2 99%  LMP 04/14/2012 Physical Exam  Constitutional: She is oriented to person, place, and time. She appears well-developed and well-nourished.  HENT:  Head: Normocephalic and atraumatic.  Eyes: Pupils are equal, round, and  reactive to light.  Neck: Normal range of motion. Neck supple.  Cardiovascular: Normal rate, regular rhythm and normal heart sounds.   Pulmonary/Chest: Effort normal and breath sounds normal. No respiratory distress. She has no wheezes. She has no rales. She exhibits no tenderness.  Abdominal: Soft. Bowel sounds are normal. There is no tenderness. There is no rebound and no guarding.  Positive tenderness in the left flank and left CVA region.   Genitourinary:  Patient does have some irritation of the vulva bilaterally. There is some white discharge in the external vaginal folds. There is no significant discharge. No cervical motion or adnexal tenderness  Musculoskeletal: Normal range of motion. She exhibits no edema.  Lymphadenopathy:    She has no cervical adenopathy.  Neurological: She is alert and oriented to person, place, and time.  Skin: Skin is warm and dry. No rash noted.  Psychiatric: She has a normal mood and affect.    ED Course  Procedures (including critical care time) Labs Review Labs Reviewed  WET PREP, GENITAL - Abnormal; Notable for the following:    WBC, Wet Prep HPF POC FEW (*)    All other components within normal limits  URINALYSIS, ROUTINE W REFLEX MICROSCOPIC (NOT AT [facility]) - Abnormal; Notable for the following:    Color, Urine ORANGE (*)    APPearance CLOUDY (*)    Specific Gravity, Urine 1.044 (*)    Bilirubin Urine SMALL (*)    Ketones, ur 15 (*)    Protein, ur 30 (*)    Nitrite POSITIVE (*)    Leukocytes, UA TRACE (*)    All other components within normal limits  URINE MICROSCOPIC-ADD ON - Abnormal; Notable for the following:    Squamous Epithelial / LPF FEW (*)    Bacteria, UA FEW (*)    All other components within normal limits  URINE CULTURE  GC/CHLAMYDIA PROBE AMP ([place]) NOT AT [facility]    Imaging Review No results found. I have personally reviewed and evaluated these images and lab results as part of my medical decision-making.   EKG Interpretation None      MDM   Final diagnoses:  UTI (lower urinary tract infection)  Candidal skin infection    Patient presents with left flank pain and dysuria. She does have evidence of a UTI. There is no hematuria which would be more consistent with a kidney stone. She has no suggestions of significant pyelonephritis. There is no fevers or vomiting. Her urine was sent for culture. She was started on Keflex and Lotrimin cream for a  probable external yeast infection. Return precautions were given.    Rolan Bucco, MD 06/16/15 [phone]

## 2015-06-17 LAB — GC/CHLAMYDIA PROBE AMP (~~LOC~~) NOT AT ARMC
Chlamydia: NEGATIVE
Neisseria Gonorrhea: NEGATIVE

## 2015-06-18 LAB — URINE CULTURE

## 2015-06-28 LAB — LIPID PANEL
Cholesterol: 146 mg/dL (ref 0–200)
HDL: 44 mg/dL (ref 35–70)
LDL Cholesterol: 84 mg/dL
TRIGLYCERIDES: 90 mg/dL (ref 40–160)

## 2015-07-21 LAB — CBC AND DIFFERENTIAL
HEMATOCRIT: 41 % (ref 36–46)
Hemoglobin: 13 g/dL (ref 12.0–16.0)
NEUTROS ABS: 5 /uL
Platelets: 236 10*3/uL (ref 150–399)
WBC: 7.5 10*3/mL

## 2015-07-21 LAB — BASIC METABOLIC PANEL
BUN: 17 mg/dL (ref 4–21)
Creatinine: 0.7 mg/dL (ref 0.5–1.1)
Potassium: 4.5 mmol/L (ref 3.4–5.3)
Sodium: 138 mmol/L (ref 137–147)

## 2015-07-21 LAB — CALCIUM: CALCIUM: 9.6 mg/dL

## 2015-07-21 LAB — ANA BY IFA, IGG: ANA BY IFA, IGG: NOT DETECTED

## 2015-07-21 LAB — CHG PROTEIN E-PHORESIS, SERUM: CRP: 16.3 mg/dL

## 2015-07-21 LAB — HEPATIC FUNCTION PANEL
ALT: 38 U/L — AB (ref 7–35)
AST: 29 U/L (ref 13–35)
Alkaline Phosphatase: 120 U/L (ref 25–125)

## 2015-09-08 ENCOUNTER — Telehealth: Payer: Self-pay | Admitting: Family Medicine

## 2015-09-08 NOTE — Telephone Encounter (Signed)
Pt states she used to be a Pt of Dr. Linford ArnoldMetheney but then moved out of the area. She is back in the area now and wants to know if Linford ArnoldMetheney has any experience treating patients with Lynch Syndrome and Lupus. Pt has been diagnosed with both of these diseases and her cancer doctor advised her to ensure what PCP she establishes with understands these diease's well. Will route.

## 2015-09-09 NOTE — Telephone Encounter (Signed)
I would say I have the base knowledge that most physicians have, but I am not an expert in these areas. She would definitely need to estab with a Rhuematologist and a GI with expetise in these areas. I would be happy to be her PCP again and can help work with her specialists if she would like.

## 2015-09-09 NOTE — Telephone Encounter (Signed)
Pt advised of recommendation. States she would like to establish care with PCP then get referrals for specialist. Pt was transferred to scheduling to make appt.

## 2015-09-15 ENCOUNTER — Encounter: Payer: Self-pay | Admitting: Family Medicine

## 2015-09-15 ENCOUNTER — Ambulatory Visit (INDEPENDENT_AMBULATORY_CARE_PROVIDER_SITE_OTHER): Payer: BLUE CROSS/BLUE SHIELD | Admitting: Family Medicine

## 2015-09-15 VITALS — BP 104/69 | HR 102 | Ht 67.0 in | Wt 267.0 lb

## 2015-09-15 DIAGNOSIS — K760 Fatty (change of) liver, not elsewhere classified: Secondary | ICD-10-CM

## 2015-09-15 DIAGNOSIS — F909 Attention-deficit hyperactivity disorder, unspecified type: Secondary | ICD-10-CM | POA: Diagnosis not present

## 2015-09-15 DIAGNOSIS — Z1509 Genetic susceptibility to other malignant neoplasm: Secondary | ICD-10-CM | POA: Diagnosis not present

## 2015-09-15 DIAGNOSIS — R635 Abnormal weight gain: Secondary | ICD-10-CM

## 2015-09-15 MED ORDER — AMPHETAMINE-DEXTROAMPHET ER 30 MG PO CP24: 30.0000 mg | ORAL_CAPSULE | ORAL | Status: DC

## 2015-09-15 NOTE — Progress Notes (Signed)
Subjective:    Patient ID: Sarah Lynn, female    DOB: 1981-04-07, 35 y.o.   MRN: [id number]  HPI She was diagnosed with Lynch Syndrome.  MLH-1. She went in for complete hysterectomh and they punctured her bowel. She recovered and then went in bladder repiar and she almost hemorrhaged to death. She has opted not to have therapy or treatment for the Lynch syndrome. She said she really doesn't want to take chemotherapy. She wants to have the best quality of life that she can have. She was told that her life expectancy would be around 35 years of age.  Was recently dx with fatty liver and enlarged spleen.  Has been evaluated for lupus.   ADD - she has been off her Adderall for about 6 months. She has gained about 60 lbs.  shehas been tryin to exercise some but her body feels weak so doesn't have the stamina. He felt more distracted and unorganized.  He is also very unhappy with her weight and so she is really stressed out about it. She says that she's gained some weight in such a short period of time that she is now getting short of breath just walking to the car. Her mother-in-law is here with her today and says that she is noticing that she's been eating more.   Review of Systems  Constitutional: Positive for activity change, fatigue and unexpected weight change. Negative for fever and diaphoresis.  HENT: Negative for hearing loss, rhinorrhea and tinnitus.   Eyes: Negative for visual disturbance.  Respiratory: Negative for cough and wheezing.   Cardiovascular: Negative for chest pain and palpitations.  Gastrointestinal: Negative for nausea, vomiting, diarrhea and blood in stool.  Genitourinary: Negative for vaginal bleeding, vaginal discharge and difficulty urinating.  Musculoskeletal: Positive for myalgias and arthralgias.  Skin: Negative for rash.  Neurological: Negative for headaches.  Hematological: Negative for adenopathy. Does not bruise/bleed easily.  Psychiatric/Behavioral:  Negative for sleep disturbance and dysphoric mood. The patient is not nervous/anxious.        Objective:   Physical Exam  Constitutional: She is oriented to person, place, and time. She appears well-developed and well-nourished.  HENT:  Head: Normocephalic and atraumatic.  Cardiovascular: Normal rate, regular rhythm and normal heart sounds.   Pulmonary/Chest: Effort normal and breath sounds normal.  Neurological: She is alert and oriented to person, place, and time.  Skin: Skin is warm and dry.  She has bluish skin over her nasal bridge.   Psychiatric: She has a normal mood and affect. Her behavior is normal.          Assessment & Plan:  Lynch Syndrome - will get records. She really needs to be established with a specialist in [place]. I'll try to find the best place for her to be seen really want someone who specializes in this. The major central [facility], [facility] or [facility] possibly.  Fatty liver - I will get her records and see where we are at and when she had her last scan. Certainly we can continue work on weight loss and diet and exercise to help control this.  ADD - will restart her Adderall. Prescription printed today. This may also help curb her appetite and help with the weight loss.   Abnormal weight gain-she said she has had her thyroid checked recently and it was normal.    She had a recent workup that showed she might have lupus. We will need to get a copy of records and  work on possibly getting her in with a rheumatologist here locally.

## 2015-10-04 ENCOUNTER — Telehealth: Payer: Self-pay | Admitting: Family Medicine

## 2015-10-04 DIAGNOSIS — Z1509 Genetic susceptibility to other malignant neoplasm: Secondary | ICD-10-CM

## 2015-10-04 NOTE — Telephone Encounter (Signed)
Spoke with Pt, would like for referral to go ahead and be placed.

## 2015-10-04 NOTE — Telephone Encounter (Signed)
Please call patient: I did speak with the genetic oncology department over at [facility]. They typically use Dr. Truitt Merle for their patients who have Lynch sydrome.  If she is okay with this referral please let me know and we can go ahead and start working on it.

## 2015-10-12 ENCOUNTER — Ambulatory Visit: Payer: BLUE CROSS/BLUE SHIELD | Admitting: Family Medicine

## 2015-10-12 ENCOUNTER — Telehealth: Payer: Self-pay

## 2015-10-12 NOTE — Telephone Encounter (Signed)
I have some notes you can fax.  I don't know if I have all or not.

## 2015-10-17 ENCOUNTER — Encounter: Payer: Self-pay | Admitting: Family Medicine

## 2015-10-17 ENCOUNTER — Ambulatory Visit (INDEPENDENT_AMBULATORY_CARE_PROVIDER_SITE_OTHER): Payer: BLUE CROSS/BLUE SHIELD | Admitting: Family Medicine

## 2015-10-17 VITALS — BP 128/81 | HR 74 | Wt 263.8 lb

## 2015-10-17 DIAGNOSIS — Z1509 Genetic susceptibility to other malignant neoplasm: Secondary | ICD-10-CM | POA: Insufficient documentation

## 2015-10-17 DIAGNOSIS — R635 Abnormal weight gain: Secondary | ICD-10-CM

## 2015-10-17 DIAGNOSIS — S29011A Strain of muscle and tendon of front wall of thorax, initial encounter: Secondary | ICD-10-CM

## 2015-10-17 DIAGNOSIS — F909 Attention-deficit hyperactivity disorder, unspecified type: Secondary | ICD-10-CM | POA: Diagnosis not present

## 2015-10-17 DIAGNOSIS — S29091A Other injury of muscle and tendon of front wall of thorax, initial encounter: Secondary | ICD-10-CM

## 2015-10-17 DIAGNOSIS — S161XXA Strain of muscle, fascia and tendon at neck level, initial encounter: Secondary | ICD-10-CM

## 2015-10-17 DIAGNOSIS — Z6841 Body Mass Index (BMI) 40.0 and over, adult: Secondary | ICD-10-CM

## 2015-10-17 MED ORDER — AMPHETAMINE-DEXTROAMPHET ER 30 MG PO CP24: 30.0000 mg | ORAL_CAPSULE | ORAL | Status: DC

## 2015-10-17 NOTE — Progress Notes (Signed)
   Subjective:    Patient ID: Sarah Lynn, female    DOB: 1980-10-16, 35 y.o.   MRN: [id number]  HPI F/U ADD - she id doing well.  She has been eating better.  She has started working out.  Has curbed her appetite.  She hasn't been skipping any meal. No insomnia. . No heart flutters.  No chest pain or palpitations on the medication and she feels like it's working well to control her symptoms.  Abnormal weight gain - she has lost 4 lbs since last saw her 4 weeks ago. She has been traveling and hasn't been eating the best.  Her swelling ha been better.  Her joint pian has been bettter since the swelling has been better.    Thinks has pulled a chest wall muscle.  Pain started about 2 days ago.  Pain in center of chest to the right.   Has appt with GI later this week.    Review of Systems     Objective:   Physical Exam  Constitutional: She is oriented to person, place, and time. She appears well-developed and well-nourished.  HENT:  Head: Normocephalic and atraumatic.  Cardiovascular: Normal rate, regular rhythm and normal heart sounds.   Pulmonary/Chest: Effort normal and breath sounds normal.  Neurological: She is alert and oriented to person, place, and time.  Skin: Skin is warm and dry.  Psychiatric: She has a normal mood and affect. Her behavior is normal.          Assessment & Plan:  ADD - well controlled on current regimen. Refills provided for the next 2 months.  Abnormal weight gain - she is lost 4 pounds and has not noticed any of the swelling. She felt like this has a lot to do with restarting her stimulant medication which certainly can help I also think it's related to the major dietary changes. She says she's been trying to eat more fruit and vegetables and staying away from fried foods. Next  Strain pectoral muscle on the right upper chest-gave reassurance. Recommended heating pad gentle stretches. She really cannot take NSAIDs because of her current diagnosis is says  Tylenol does not work well for her. Call if not improving in the next week.  Cervical strain-she can continue to use her TENS unit which is very helpful. Continue with heat and gentle stretches. Handout given for cervical and upper back stretches.  Sarah Lynn has appointment on Thursday with GI for further evaluation workup should. She will need to be scheduled for a mammogram as well but wants to do that at her next appointment.

## 2015-10-18 ENCOUNTER — Encounter: Payer: Self-pay | Admitting: Family Medicine

## 2015-10-19 ENCOUNTER — Telehealth: Payer: Self-pay | Admitting: Family Medicine

## 2015-10-19 NOTE — Telephone Encounter (Signed)
Please call pt: find out who would have her records for her Lynch Syndrome?  The records I got from Dr. Elease Etienne didn't have any info on her diagnosis.  Thank you.

## 2015-10-19 NOTE — Telephone Encounter (Signed)
Called pt and she stated that this was done @ [facility]. By Alvia Grove W/[facility] in [place] [phone]   Dr. Delma Freeze w/[place] [phone]

## 2015-10-20 ENCOUNTER — Telehealth: Payer: Self-pay

## 2015-10-20 NOTE — Telephone Encounter (Signed)
Called the Hem/Onc office, medical records pertaining to Pt's Lynch Syndrome diagnosis are being faxed over.

## 2015-10-20 NOTE — Telephone Encounter (Signed)
Sarah Lynn was seen by Dr Laurine Blazer, GI, and states she doesn't feel comfortable with him. She wants to be referred to a different doctor. She wants to know if she should also change Oncologist also? Shouldn't the GI and Oncologist talk to each other, patient is asking. Please advise.

## 2015-10-20 NOTE — Telephone Encounter (Signed)
Can you check on this. I had referral her to Dr. Lanell Matar so I am not sure why saw Dr. Laurine Blazer? Are they in the same office?  We can place new GI referral.

## 2015-10-21 NOTE — Telephone Encounter (Signed)
Called Dr. Jacky Kindle office, they did not see the referral. Will resend information and they will contact Pt.

## 2015-10-28 ENCOUNTER — Encounter: Payer: Self-pay | Admitting: Family Medicine

## 2015-12-21 ENCOUNTER — Telehealth: Payer: Self-pay

## 2015-12-21 DIAGNOSIS — F909 Attention-deficit hyperactivity disorder, unspecified type: Secondary | ICD-10-CM

## 2015-12-21 MED ORDER — AMPHETAMINE-DEXTROAMPHET ER 30 MG PO CP24: 30.0000 mg | ORAL_CAPSULE | ORAL | Status: DC

## 2015-12-21 NOTE — Telephone Encounter (Signed)
Patient called for a refill on Adderall. Patient advised we will not refill again without a follow up appointment.

## 2015-12-29 ENCOUNTER — Encounter: Payer: Self-pay | Admitting: Family Medicine

## 2015-12-29 ENCOUNTER — Ambulatory Visit (INDEPENDENT_AMBULATORY_CARE_PROVIDER_SITE_OTHER): Payer: BLUE CROSS/BLUE SHIELD | Admitting: Family Medicine

## 2015-12-29 ENCOUNTER — Telehealth: Payer: Self-pay | Admitting: Family Medicine

## 2015-12-29 VITALS — BP 115/84 | HR 75 | Ht 67.0 in | Wt 260.0 lb

## 2015-12-29 DIAGNOSIS — F909 Attention-deficit hyperactivity disorder, unspecified type: Secondary | ICD-10-CM

## 2015-12-29 DIAGNOSIS — R600 Localized edema: Secondary | ICD-10-CM

## 2015-12-29 DIAGNOSIS — Z1509 Genetic susceptibility to other malignant neoplasm: Secondary | ICD-10-CM | POA: Diagnosis not present

## 2015-12-29 DIAGNOSIS — R609 Edema, unspecified: Secondary | ICD-10-CM

## 2015-12-29 LAB — CBC WITH DIFFERENTIAL/PLATELET
BASOS ABS: 0 {cells}/uL (ref 0–200)
BASOS PCT: 0 %
EOS ABS: 172 {cells}/uL (ref 15–500)
Eosinophils Relative: 2 %
HCT: 41.3 % (ref 35.0–45.0)
HEMOGLOBIN: 13.8 g/dL (ref 11.7–15.5)
LYMPHS ABS: 2322 {cells}/uL (ref 850–3900)
Lymphocytes Relative: 27 %
MCH: 29 pg (ref 27.0–33.0)
MCHC: 33.4 g/dL (ref 32.0–36.0)
MCV: 86.8 fL (ref 80.0–100.0)
MONOS PCT: 6 %
MPV: 10.7 fL (ref 7.5–12.5)
Monocytes Absolute: 516 cells/uL (ref 200–950)
NEUTROS ABS: 5590 {cells}/uL (ref 1500–7800)
Neutrophils Relative %: 65 %
PLATELETS: 255 10*3/uL (ref 140–400)
RBC: 4.76 MIL/uL (ref 3.80–5.10)
RDW: 14.1 % (ref 11.0–15.0)
WBC: 8.6 10*3/uL (ref 3.8–10.8)

## 2015-12-29 LAB — COMPLETE METABOLIC PANEL WITH GFR
ALT: 43 U/L — ABNORMAL HIGH (ref 6–29)
AST: 29 U/L (ref 10–30)
Albumin: 3.9 g/dL (ref 3.6–5.1)
Alkaline Phosphatase: 135 U/L — ABNORMAL HIGH (ref 33–115)
BUN: 16 mg/dL (ref 7–25)
CHLORIDE: 102 mmol/L (ref 98–110)
CO2: 28 mmol/L (ref 20–31)
Calcium: 9.3 mg/dL (ref 8.6–10.2)
Creat: 0.7 mg/dL (ref 0.50–1.10)
GFR, Est African American: >89 mL/min (ref >=60)
GLUCOSE: 93 mg/dL (ref 65–99)
POTASSIUM: 4.2 mmol/L (ref 3.5–5.3)
SODIUM: 139 mmol/L (ref 135–146)
Total Bilirubin: 0.5 mg/dL (ref 0.2–1.2)
Total Protein: 7.3 g/dL (ref 6.1–8.1)

## 2015-12-29 LAB — TSH: TSH: 2.37 mIU/L

## 2015-12-29 MED ORDER — METFORMIN HCL 500 MG PO TABS: 500.0000 mg | ORAL_TABLET | Freq: Two times a day (BID) | ORAL | Status: DC

## 2015-12-29 MED ORDER — AMPHETAMINE-DEXTROAMPHET ER 30 MG PO CP24: 30.0000 mg | ORAL_CAPSULE | ORAL | Status: DC

## 2015-12-29 MED ORDER — AMPHETAMINE-DEXTROAMPHET ER 30 MG PO CP24: 30.0000 mg | ORAL_CAPSULE | ORAL | Status: AC

## 2015-12-29 NOTE — Progress Notes (Signed)
Subjective:    Patient ID: Sarah Lynn, female    DOB: [birth year], 35 y.o.   MRN: [id number]  HPI Follow-up ADHD-She is doing well on her stimulant. No chest pain shortest of breath or palpitations. She feels like it's working well.   Severe obesity/BMI 41-she's down 3 pounds from February. She just continues to work on diet and exercise. Strongly encouraged her to work on increasing her hydration. Explained that it can really affect her kidneys and how she feels daily. It also helps curb appetite.  Is also concerned about recent swelling in her legs and her hands. She said she really noticed it a couple of weeks ago when she went in for a sinus infection. It does seem to come and go but it's worse by the end of the day. She says she really only drinks about 8 ounces of water per day.  She denies excess salt in the diet.  Lynch syndrome-she did have her colonoscopy and endoscopy.The provider at the [facility] also recommended that she have an evaluation of her pancreas done. The patient says she just tired of going back and forth to see doctors. She is also referred to hematology oncology but no showed for that appointment. She said she really rather look at a place with is a comprehensive Center for people who have Lynch syndrome. She read about online of place for example like the [facility] where you can go and have all the testing meds needed yearly for people with one syndrome within a couple days and said having to go back and forth. She is really interested in seeing if there may be something like this at one of the other major medical centers such as at [facility] her ECG.  Review of Systems  BP 115/84 mmHg  Pulse 75  Ht 5' 7" (1.702 m)  Wt 260 lb (117.935 kg)  BMI 40.71 kg/m2  SpO2 100%  LMP 04/14/2012    Allergies  Allergen Reactions  . Tramadol Other (See Comments)    MS changes    Past Medical History  Diagnosis Date  . MLH1-related Lynch syndrome [facility])      Past Surgical History  Procedure Laterality Date  . Cesarean section      X 3  . Bladder surgery    . Abdominal hysterectomy      Social History   Social History  . Marital Status: Married    Spouse Name: N/A  . Number of Children: N/A  . Years of Education: N/A   Occupational History  . Not on file.   Social History Main Topics  . Smoking status: Never Smoker   . Smokeless tobacco: Not on file  . Alcohol Use: No  . Drug Use: No  . Sexual Activity: Yes    Birth Control/ Protection: None   Other Topics Concern  . Not on file   Social History Narrative    Family History  Problem Relation Age of Onset  . Depression Mother   . Diabetes Mother   . Cancer Mother     cervical cancer  . Diabetes Other     Outpatient Encounter Prescriptions as of 12/29/2015  Medication Sig  . amphetamine-dextroamphetamine (ADDERALL XR) 30 MG 24 hr capsule Take 1 capsule (30 mg total) by mouth every morning. Ok to fill on 03/20/2016  . [DISCONTINUED] amphetamine-dextroamphetamine (ADDERALL XR) 30 MG 24 hr capsule Take 1 capsule (30 mg total) by mouth every morning. Ok to fill on 11/14/2015  . [  DISCONTINUED] amphetamine-dextroamphetamine (ADDERALL XR) 30 MG 24 hr capsule Take 1 capsule (30 mg total) by mouth every morning. Ok to fill on 01/19/2016  . [DISCONTINUED] amphetamine-dextroamphetamine (ADDERALL XR) 30 MG 24 hr capsule Take 1 capsule (30 mg total) by mouth every morning. Ok to fill on 02/19/2016  . metFORMIN (GLUCOPHAGE) 500 MG tablet Take 1 tablet (500 mg total) by mouth 2 (two) times daily with a meal.   No facility-administered encounter medications on file as of 12/29/2015.          Objective:   Physical Exam  Constitutional: She is oriented to person, place, and time. She appears well-developed and well-nourished.  HENT:  Head: Normocephalic and atraumatic.  Cardiovascular: Normal rate, regular rhythm and normal heart sounds.   Pulmonary/Chest: Effort normal and breath  sounds normal.  Musculoskeletal: She exhibits edema.  Trace pedal edema bilaterally.   Neurological: She is alert and oriented to person, place, and time.  Skin: Skin is warm and dry.  Psychiatric: She has a normal mood and affect. Her behavior is normal.        Assessment & Plan:  ADHD-Well-controlled on current regimen. Perceptions given for the next 90 days.  Morbid obesity/BMI 40-discussed option since her insurance will not cover prescription weight loss medications we will try starting with metformin. I did warn about potential for GI side effects. She will need to hydrate with this medication as well. I like to see her back in about 6-8 weeks to make sure that she is doing well on it. Call if any palms in the interim continue to work on exercise and diet. Next  Lynch syndrome-we'll check in to see if any of the other health systems have a more comprehensive program the specific disorder. For now she at least has had her colonoscopy and her endoscopy  Extremity edema-suspect probably related to her obesity. We did discuss the option of a diuretic but just topic she would be a good candidate because she really has such poor fluid intake. Really watch salt in the diet.

## 2015-12-29 NOTE — Telephone Encounter (Signed)
[facility] Need office visit notes from Dr.Girish Meshra, gastroenterology. And able to locate EPIC

## 2015-12-29 NOTE — Patient Instructions (Addendum)
Try to increase water intake to at least 8 ounces with each meal. Try to keep water bottle on him with you and sip on it in between meals. You can even use a little bit of pressure squeezed fruit and it to give it a little bit of flavor. Lemon water is a great choice. Try to stay way from any artificial sweeteners.   When you start the metformin even though your bottle says twice a day just take it once a day with breakfast. Make sure to take with 8 ounces of water. If you tolerate it well after 1-2 weeks and okay to increase to twice a day.

## 2015-12-30 NOTE — Telephone Encounter (Signed)
Sent STAT request to Medical Records for notes [phone].Sarah PacasBarkley, Sarah Lynn Sarah BendLynetta

## 2016-01-26 ENCOUNTER — Emergency Department (HOSPITAL_BASED_OUTPATIENT_CLINIC_OR_DEPARTMENT_OTHER): Payer: BLUE CROSS/BLUE SHIELD

## 2016-01-26 ENCOUNTER — Encounter (HOSPITAL_BASED_OUTPATIENT_CLINIC_OR_DEPARTMENT_OTHER): Payer: Self-pay | Admitting: *Deleted

## 2016-01-26 ENCOUNTER — Emergency Department (HOSPITAL_BASED_OUTPATIENT_CLINIC_OR_DEPARTMENT_OTHER)
Admission: EM | Admit: 2016-01-26 | Discharge: 2016-01-27 | Disposition: A | Discharge status: Home / Self Care | Payer: BLUE CROSS/BLUE SHIELD | Attending: Emergency Medicine | Admitting: Emergency Medicine

## 2016-01-26 DIAGNOSIS — Y999 Unspecified external cause status: Secondary | ICD-10-CM | POA: Diagnosis not present

## 2016-01-26 DIAGNOSIS — Y929 Unspecified place or not applicable: Secondary | ICD-10-CM | POA: Insufficient documentation

## 2016-01-26 DIAGNOSIS — Y9301 Activity, walking, marching and hiking: Secondary | ICD-10-CM | POA: Diagnosis not present

## 2016-01-26 DIAGNOSIS — S8012XA Contusion of left lower leg, initial encounter: Secondary | ICD-10-CM | POA: Diagnosis not present

## 2016-01-26 DIAGNOSIS — S8992XA Unspecified injury of left lower leg, initial encounter: Secondary | ICD-10-CM | POA: Diagnosis present

## 2016-01-26 DIAGNOSIS — T148XXA Other injury of unspecified body region, initial encounter: Secondary | ICD-10-CM

## 2016-01-26 DIAGNOSIS — W182XXA Fall in (into) shower or empty bathtub, initial encounter: Secondary | ICD-10-CM | POA: Insufficient documentation

## 2016-01-26 DIAGNOSIS — M79605 Pain in left leg: Secondary | ICD-10-CM

## 2016-01-26 LAB — CBC WITH DIFFERENTIAL/PLATELET
Basophils Absolute: 0 10*3/uL (ref 0.0–0.1)
Basophils Relative: 0 %
Eosinophils Absolute: 0.3 10*3/uL (ref 0.0–0.7)
Eosinophils Relative: 3 %
HCT: 38.7 % (ref 36.0–46.0)
HEMOGLOBIN: 12.9 g/dL (ref 12.0–15.0)
LYMPHS ABS: 3.4 10*3/uL (ref 0.7–4.0)
LYMPHS PCT: 34 %
MCH: 29.5 pg (ref 26.0–34.0)
MCHC: 33.3 g/dL (ref 30.0–36.0)
MCV: 88.4 fL (ref 78.0–100.0)
Monocytes Absolute: 0.8 10*3/uL (ref 0.1–1.0)
Monocytes Relative: 8 %
NEUTROS ABS: 5.5 10*3/uL (ref 1.7–7.7)
NEUTROS PCT: 55 %
Platelets: 250 10*3/uL (ref 150–400)
RBC: 4.38 MIL/uL (ref 3.87–5.11)
RDW: 13.5 % (ref 11.5–15.5)
WBC: 10 10*3/uL (ref 4.0–10.5)

## 2016-01-26 NOTE — ED Notes (Signed)
Attempted IV stick without success, sent pt to [facility]

## 2016-01-26 NOTE — ED Notes (Signed)
Patient transported to Ultrasound 

## 2016-01-26 NOTE — ED Provider Notes (Signed)
CSN: [id number]     Arrival date & time 01/26/16  2213 History   First MD Initiated Contact with Patient 01/26/16 2220     Chief Complaint  Patient presents with  . Fall     (Consider location/radiation/quality/duration/timing/severity/associated sxs/prior Treatment) HPI   Sarah Lynn is a(n) 35 y.o. female who presents To the emergency department with chief complaint of leg pain. She is a complicated past medical history Lynch syndrome. She is status post total hysterectomy. She is under care of a female [facility]. Patient states that she fell getting into a walk-in shower/tub and hit her left shin against the top. Patient states she did not have very much pain at the time of this. She did have some bruising. She felt okay for the next couple days and then took a trip in the car to [place]. The day after that she began having left leg pain and swelling. She is a history of previous DVT is concern for blood clot. She states the pain is heavy, aching and severe. She denies any numbness or tingling in the left leg.  Past Medical History  Diagnosis Date  . MLH1-related Lynch syndrome [facility])    Past Surgical History  Procedure Laterality Date  . Cesarean section      X 3  . Bladder surgery    . Abdominal hysterectomy     Family History  Problem Relation Age of Onset  . Depression Mother   . Diabetes Mother   . Cancer Mother     cervical cancer  . Diabetes Other    Social History  Substance Use Topics  . Smoking status: Never Smoker   . Smokeless tobacco: None  . Alcohol Use: No   OB History    No data available     Review of Systems   Ten systems reviewed and are negative for acute change, except as noted in the HPI.   Allergies  Tramadol  Home Medications   Prior to Admission medications   Medication Sig Start Date End Date Taking? Authorizing Provider  amphetamine-dextroamphetamine (ADDERALL XR) 30 MG 24 hr capsule Take 1 capsule (30 mg total) by  mouth every morning. Ok to fill on 03/20/2016 12/29/15 12/28/20  Agapito Games, MD  metFORMIN (GLUCOPHAGE) 500 MG tablet Take 1 tablet (500 mg total) by mouth 2 (two) times daily with a meal. 12/29/15   Agapito Games, MD   BP 111/69 mmHg  Pulse 85  Temp(Src) 98.6 F (37 C) (Oral)  Resp 18  Ht 5\' 7"  (1.702 m)  Wt 117.935 kg  BMI 40.71 kg/m2  SpO2 98%  LMP 04/14/2012 Physical Exam  Constitutional: She is oriented to person, place, and time. She appears well-developed and well-nourished. No distress.  HENT:  Head: Normocephalic and atraumatic.  Eyes: Conjunctivae are normal. No scleral icterus.  Neck: Normal range of motion.  Cardiovascular: Normal rate, regular rhythm and normal heart sounds.  Exam reveals no gallop and no friction rub.   No murmur heard. Pulmonary/Chest: Effort normal and breath sounds normal. No respiratory distress.  Abdominal: Soft. Bowel sounds are normal. She exhibits no distension and no mass. There is no tenderness. There is no guarding.  Musculoskeletal:  Bruising in the Left shin.  Swelling in the leg L > R  Neurological: She is alert and oriented to person, place, and time.  Skin: Skin is warm and dry. She is not diaphoretic.  Nursing note and vitals reviewed.   ED Course  Procedures (including  critical care time) Labs Review Labs Reviewed  COMPREHENSIVE METABOLIC PANEL - Abnormal; Notable for the following:    Glucose, Bld 130 (*)    All other components within normal limits  CBC WITH DIFFERENTIAL/PLATELET    Imaging Review No results found. I have personally reviewed and evaluated these images and lab results as part of my medical decision-making.   EKG Interpretation None      MDM   Final diagnoses:  Pain of left lower extremity  Bruising    Work up negative for DVT. Her labs are also wnl except for mild hyperglycemia. She is appears safe for discharge. No signs of infection. Follow up with pcp. Discussed return  precautions     Margarita Mail, PA-C 01/29/16 [place], MD 01/29/16 2110

## 2016-01-26 NOTE — ED Notes (Signed)
Pt c/o swelling, warmth, pain and bruising to LLE since last night.  She fell in the shower several days ago and got the bruising from than, also drove to [place] and sits for long periods at her job.  She is tender along LLE, swelling and warmth distal to bruising.

## 2016-01-26 NOTE — ED Notes (Signed)
She fell in the shower a week ago. Pain in her lower left leg since the fall.

## 2016-01-27 LAB — COMPREHENSIVE METABOLIC PANEL
ALBUMIN: 3.5 g/dL (ref 3.5–5.0)
ALK PHOS: 114 U/L (ref 38–126)
ALT: 31 U/L (ref 14–54)
ANION GAP: 7 (ref 5–15)
AST: 24 U/L (ref 15–41)
BILIRUBIN TOTAL: 0.5 mg/dL (ref 0.3–1.2)
BUN: 18 mg/dL (ref 6–20)
CALCIUM: 8.9 mg/dL (ref 8.9–10.3)
CO2: 27 mmol/L (ref 22–32)
Chloride: 105 mmol/L (ref 101–111)
Creatinine, Ser: 0.63 mg/dL (ref 0.44–1.00)
Glucose, Bld: 130 mg/dL — ABNORMAL HIGH (ref 65–99)
Potassium: 3.7 mmol/L (ref 3.5–5.1)
Sodium: 139 mmol/L (ref 135–145)
TOTAL PROTEIN: 7.3 g/dL (ref 6.5–8.1)

## 2016-01-27 NOTE — ED Notes (Signed)
Pt verbalizes understanding of d/c instructions and denies any further needs at this time. 

## 2016-01-27 NOTE — Discharge Instructions (Signed)
Your exam showed no actue abnormalities.  Your blood sugar was slightly elevated today (130) and should be rechecked by your primary care doctor. You may take over the counter pain medications to control your symptoms. Apply ice to the affected area. Hematoma A hematoma is a collection of blood under the skin, in an organ, in a body space, in a joint space, or in other tissue. The blood can clot to form a lump that you can see and feel. The lump is often firm and may sometimes become sore and tender. Most hematomas get better in a few days to weeks. However, some hematomas may be serious and require medical care. Hematomas can range in size from very small to very large. CAUSES  A hematoma can be caused by a blunt or penetrating injury. It can also be caused by spontaneous leakage from a blood vessel under the skin. Spontaneous leakage from a blood vessel is more likely to occur in older people, especially those taking blood thinners. Sometimes, a hematoma can develop after certain medical procedures. SIGNS AND SYMPTOMS   A firm lump on the body.  Possible pain and tenderness in the area.  Bruising.Blue, dark blue, purple-red, or yellowish skin may appear at the site of the hematoma if the hematoma is close to the surface of the skin. For hematomas in deeper tissues or body spaces, the signs and symptoms may be subtle. For example, an intra-abdominal hematoma may cause abdominal pain, weakness, fainting, and shortness of breath. An intracranial hematoma may cause a headache or symptoms such as weakness, trouble speaking, or a change in consciousness. DIAGNOSIS  A hematoma can usually be diagnosed based on your medical history and a physical exam. Imaging tests may be needed if your health care provider suspects a hematoma in deeper tissues or body spaces, such as the abdomen, head, or chest. These tests may include ultrasonography or a CT scan.  TREATMENT  Hematomas usually go away on their own  over time. Rarely does the blood need to be drained out of the body. Large hematomas or those that may affect vital organs will sometimes need surgical drainage or monitoring. HOME CARE INSTRUCTIONS   Apply ice to the injured area:   Put ice in a plastic bag.   Place a towel between your skin and the bag.   Leave the ice on for 20 minutes, 2-3 times a day for the first 1 to 2 days.   After the first 2 days, switch to using warm compresses on the hematoma.   Elevate the injured area to help decrease pain and swelling. Wrapping the area with an elastic bandage may also be helpful. Compression helps to reduce swelling and promotes shrinking of the hematoma. Make sure the bandage is not wrapped too tight.   If your hematoma is on a lower extremity and is painful, crutches may be helpful for a couple days.   Only take over-the-counter or prescription medicines as directed by your health care provider. SEEK IMMEDIATE MEDICAL CARE IF:   You have increasing pain, or your pain is not controlled with medicine.   You have a fever.   You have worsening swelling or discoloration.   Your skin over the hematoma breaks or starts bleeding.   Your hematoma is in your chest or abdomen and you have weakness, shortness of breath, or a change in consciousness.  Your hematoma is on your scalp (caused by a fall or injury) and you have a worsening headache or a  change in alertness or consciousness. MAKE SURE YOU:   Understand these instructions.  Will watch your condition.  Will get help right away if you are not doing well or get worse.   This information is not intended to replace advice given to you by your health care provider. Make sure you discuss any questions you have with your health care provider.   Document Released: 04/10/2004 Document Revised: 04/29/2013 Document Reviewed: 02/04/2013 Elsevier Interactive Patient Education [employer]2016 Elsevier Inc.

## 2016-03-12 ENCOUNTER — Encounter: Payer: Self-pay | Admitting: Osteopathic Medicine

## 2016-03-12 ENCOUNTER — Ambulatory Visit (INDEPENDENT_AMBULATORY_CARE_PROVIDER_SITE_OTHER): Payer: BLUE CROSS/BLUE SHIELD | Admitting: Osteopathic Medicine

## 2016-03-12 VITALS — BP 110/80 | HR 90 | Ht 68.0 in | Wt 271.0 lb

## 2016-03-12 DIAGNOSIS — F909 Attention-deficit hyperactivity disorder, unspecified type: Secondary | ICD-10-CM

## 2016-03-12 DIAGNOSIS — J069 Acute upper respiratory infection, unspecified: Secondary | ICD-10-CM

## 2016-03-12 DIAGNOSIS — B9789 Other viral agents as the cause of diseases classified elsewhere: Principal | ICD-10-CM

## 2016-03-12 MED ORDER — BENZONATATE 200 MG PO CAPS: 200.0000 mg | ORAL_CAPSULE | Freq: Two times a day (BID) | ORAL | Status: AC | PRN

## 2016-03-12 MED ORDER — IPRATROPIUM BROMIDE 0.03 % NA SOLN: 2.0000 | Freq: Three times a day (TID) | NASAL | Status: AC

## 2016-03-12 NOTE — Progress Notes (Signed)
HPI: Sarah Lynn is a 35 y.o. female who [facility]  today for chief complaint of:  Chief Complaint  Patient presents with  . Sinus Problem    Acute illness . Location: Sinuses/upper respiratory . Quality: fever on and off, sore throat, sinus, cold symptoms. Has been getting a bit better past few days but just can't seem to shake it . Assoc signs/symptoms: see ROS  . Duration: 5 days Modifying factors: has tried the following OTC/Rx medications: whiskey and honey/lemon which helped cough, other OTC things not helping. (+)sick contacts.   Note: Patient also requests refill of Adderall extended release. Apparently had been told when she made this appointment that Dr. Linford ArnoldMetheney did not have opening discuss this issue but I may be able to help her with it. Patient states that her children maybe threw away/misplaced the medication/prescription. She has been without the medicine for about 2 months.  Past medical, social and family history reviewed. Current medications and allergies reviewed.     Review of Systems: CONSTITUTIONAL: subjective fever/chills HEAD/EYES/EARS/NOSE/THROAT: yes headache, no vision change or hearing change, yes sore throat CARDIAC: No chest pain, No pressure/palpitations RESPIRATORY: yes cough, no shortness of breath GASTROINTESTINAL: (+) nausea, no vomiting, no abdominal pain, (+) diarrhea yesterday  MUSCULOSKELETAL: generlalized myalgia/arthralgia SKIN: no rash   Exam:  BP 110/80 mmHg  Pulse 90  Ht 5\' 8"  (1.727 m)  Wt 271 lb (122.925 kg)  BMI 41.22 kg/m2  LMP 04/14/2012 Constitutional: VSS, see above. General Appearance: alert, well-developed, well-nourished, NAD Eyes: Normal lids and conjunctive, non-icteric sclera, PERRLA Ears, Nose, Mouth, Throat: Normal external inspection ears/nares/mouth/lips/gums, normal TM, MMM;       posterior pharynx with erythema, without exudate, nasal mucosa normal Neck: No  masses, trachea midline. normal lymph nodes Respiratory: Normal respiratory effort. No  wheeze/rhonchi/rales Cardiovascular: S1/S2 normal, no murmur/rub/gallop auscultated. RRR.    ASSESSMENT/PLAN:   Viral URI with cough - Plan: ipratropium (ATROVENT) 0.03 % nasal spray, benzonatate (TESSALON) 200 MG capsule Treat symptomatically with nasal spray and cough medication, list printed for appropriate over-the-counter medicines, would also recommend oral decongestant. Call if not feeling better 7-10 days after initial onset of symptoms, depending on how she is feeling done and what her symptoms are may consider antibiotics for secondary bacterial sinusitis.  Attention deficit hyperactivity disorder (ADHD), unspecified ADHD type briefly discussed with Dr. Eppie GibsonMetheny to clarify medications/prescription pattern. At last visit, patient had been given 3 physical prescriptions, post dated, to last as 90 day supply of medications for Adderall XR, last was written okay to fill on 03/20/2016, so patient should be adequately supplied until early August, 2017. Patient was advised that we cannot be held responsible for lost medicines or prescriptions, no early refills will be given by myself, she would need to discuss the matter further with her primary care provider. See printed instructions. 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 ER/RTC precautions reviewed. All questions answered. Return for ADHD medication management with PCP - ASAP.

## 2016-03-12 NOTE — Patient Instructions (Signed)
Dr. Shelah LewandowskyMetheney's records indicate she gave you three physical prescriptions, post-dated to give 90-day supply of ADHD medicines - one of which was dated to fill 03/20/2016 for a 30 days supply, which would mean your next refill is not due until 04/20/2016. We cannot be held responsible for lost prescriptions or medications, and no early refills will be approved at this time, per Dr. Shelah LewandowskyMetheney's instructions. Please schedule an appointment to discuss this further with Dr. Linford ArnoldMetheney!      DR. Mardelle [facility]S [facility] C[facility] INSTRUCTIONS: VIRAL ILLNESSES - ACHES/PAINS, SORE THROAT, SINUSITIS, COUGH, GASTRITIS   FRIST, A FEW NOTES ON OVER-THE-COUNTER MEDICATIONS!   USE CAUTION - MANY OVER-THE-COUNTER MEDICATIONS COME IN COMBINATIONS OF MULTIPLE GENERIC MEDICINES. FOR INSTANCE, [facility]QUIL HAS TYLENOL + COUGH MEDICINE + AN ANTIHISTAMINE, SO BE CAREFUL YOU'RE NOT TAKING A COMBINATION MEDICINE WHICH CONTAINS MEDICATIONS YOU'RE ALSO TAKING SEPARATELY (LIKE [facility] SYRUP PLUS TYLENOL PILLS).   YOUR PHARMACIST CAN HELP YOU AVOID MEDICATION INTERACTIONS AND DUPLICATIONS - ASK FOR THEIR HELP IF YOU ARE CONFUSED OR UNSURE ABOUT WHAT TO PURCHASE OVER-THE-COUNTER!   REMEMBER - IF YOU'RE EVER CONCERNED ABOUT MEDICATION SIDE EFFECTS, OR IF YOU'RE EVER CONCERNED YOUR SYMPTOMS ARE GETTING WORSE DESPITE TREATMENT, PLEASE CALL THE DOCTOR'S OFFICE! IF AFTER-HOURS, YOU CAN BE SEEN IN URGENT CARE OR, FOR SEVERE ILLNESS, PLEASE GO TO THE EMERGENCY ROOM.   TREAT SINUS CONGESTION, RUNNY NOSE & POSTNASAL DRIP:  Treat to increase airflow through sinuses, decrease congestion pain and prevent bacterial growth!   Remember, only 0.5-2% of sinus infections are due to a bacteria, the rest are due to a virus (usually the common cold) which will not get better with antibiotics!  NASAL SPRAYS: generally safe and should not interact with other medicines. Can take any of these medications, either alone or together... FLONASE (FLUTICASONE) - 2  sprays in each nostril twice per day (also a great allergy medicine to use long-term!) AFRIN (OXYMETOLAZONE) - use sparingly because it will cause rebound congestion, NEVER USE IN KIDS  SALINE NASAL SPRAY- no limit, but avoid use after other nasal sprays or it can wash the medicine away PRESCRTIPTION ATROVENT - as directed on prescription bottle  ANTIHISTAMINES: Helps dry out runny nose and decreases postnasal drip. Benadryl may cause drowsiness but other preparations should not be as sedating. Certain kinds are not as safe in elderly individuals. OK to use unless Dr A says otherwise.  Only use one of the following... BENADRYL (DIPHENHYDRAMINE) - 25-50 mg every 6 hours ZYRTEC (CETIRIZINE) - 5-10 mg daily CLARITIN (LORATIDINE) - less potent. 10 mg daily ALLEGRA (FEXOFENADINE) - least likely to cause drowsiness! 180 mg daily or 60 mg twice per day  DECONGESTANTS: Helps dry out runny nose and helps with sinus pain. May cause insomnia, or sometimes elevated heart rate. Can cause problems if used often in people with high blood pressure. OK to use unless Dr A says otherwise. NEVER USE IN KIDS UNDER 34 YEARS OLD. Only use one of the following... SUDAFED (PSEUDOEPHEDINE) - 60 mg every 4 - 6 hours, also comes in 120 mg extended release every 12 hours, maximum 240 mg in 24 hours.  SUDAED PE (PHENYLEPHRINE) - 10 mg every 4 - 6 hours, maximum 60 mg per day  COMBINATIONS OF ABOVE ANTIHISTAMINES + DECONGESTANTS: these usually require you to show your ID at the pharmacy counter. You can also purchase these medicines separately as noted above.  Only use one of the following... ZYRTEC-D (CETIRIZINE + PSEUDOEPHEDRINE) - 12 hour formulation as directed CLARITIN-D (  LORATIDINE + PSEUDOEPHEDRINE) - 12 and 24 hour formulations as directed ALLEGRA-D (FEXOFENADINE + PSEUDOEPHEDRINE) - 12 and 24 hour formulations as directed  PRESCRIPTION TREATMENT FOR SINUS PROBLEMS: Can include nasal sprays, pills, or antibiotics  in the case of true bacterial infection. Not everyone needs an antibiotic but there are other medicines which will help you feel better while your body fights the infection!    TREAT COUGH & SORE THROAT:  Remember, cough is the body's way of protecting your airways and lungs, it's a hard-wired reflex that is tough for medicines to treat!   Irritation to the airways will cause cough. This irritation is usually caused by upper airway problems like postnasal drip (treat as above) and viral sore throat, but in severe cases can be due to lower airway problems like bronchitis or pneumonia, which a doctor can usually hear on exam of your lungs or see on an X-ray.  Sore throat is almost always due to a virus, but occasionally caused by certain strains of Strep, which requires antibiotics.   Exercise and smoking may make cough worse - take it easy while you're sick, and QUIT SMOKING!   Cough due to viral infection can linger for 2-3 weeks or so. If you're coughing longer than you think you should, or if the cough is severe, please make an appointment in the office - you may need a chest X-ray.  EXPECTORANT: Used to help clear airways, take these with PLENTY of water to help thin mucus secretions and make the mucus easier to cough up  Only use one of the following... ROBITUSSIN (DEXTROMETHORPHAN OR GUAIFENISEN depending on formulation)  MUCINEX (GUAIFENICEN) - usually longer acting  COUGH DROPS/LOZENGES: Whichever over-the-counter agent you prefer!  Here are some suggestions for ingredients to look for (can take both)... BENZOCAINE - numbing effect, also in CHLORASEPTIC THROAT SPRAY MENTHOL - cooling effect  HONEY: has gone head-to-head in several clinical trials with prescription cough medicines and found to be equally effective! Try 1 Teaspoon Honey + 2 Drops Lemon Juice, as much as you want to use. NONE FOR KIDS UNDER AGE 65  HERBAL TEA: There are certain ingredients which help "coat the  throat" to relieve pain  such as ELM BARK, LICORICE ROOT, MARSHMALLOW ROOT  PRESCRIPTION TREATMENT FOR COUGH: Reserved for severe cases. Can include pills, syrups or inhalers.    TREAT ACHES & PAINS, FEVER:  Illness causes aches, pains and fever as your body increases its natural inflammation response to help fight the infection.   Rest, good hydration and nutrition, and taking anti-inflammatory medications will help.   Remember: a true fever is a temperature 100.4 or higher. If you have a fever that is 105.0 or higher, that is a dangerous level and needs medical attention in the office or in the ER!  Can take both of these together if it's ok with your doctor... IBUPROFEN - 400-800 mg every 6 - 8 hours. Ibuprofen and similar medications can cause problems for people with heart disease or history of stomach ulcers, check with Dr A first if you're concerned. Lower doses are usually safe and effective.  TYLENOL (ACETAMINOPHEN) - [phone] mg every 6 hours. It won't cause problems with heart or stomach.     TREAT GASTROINTESTINAL SYMPTOMS:  Hydrate, hydrate, hydrate! Try drinking Gatorade/Powerade, broth/soup. Avoid milk and juice, these can make diarrhea worse. You can drink water, of course, but if you are also having vomiting and loose stool you are losing electrolytes which water alone can't  replace.  IMMODIUM (LOPERAMIDE) - as directed on the bottle to help with loose stool PRESCRIPTION ZOFRAN (ONDANSETRON) OR PHENERGAN (PROMETHAZINE) - as directed to help nausea and vomiting. Try taking these before you eat if you are having trouble keeping food down.     REMEMBER - THE MOST IMPORTANT THINGS YOU CAN DO TO AVOID CATCHING OR SPREADING ILLNESS INCLUDE:   COVER YOUR COUGH WITH YOUR ARM, NOT WITH YOUR HANDS!   DISINFECT COMMONLY USED SURFACES (SUCH AS TELEPHONES & DOORKNOBS) WHEN YOU OR SOMEONE CLOSE TO YOU IS FEELING SICK!   BE SURE VACCINES ARE UP TO DATE - GET A FLU SHOT  EVERY YEAR! THE FLU CAN BE DEADLY FOR BABIES, ELDERLY FOLKS, AND PEOPLE WITH WEAK IMMUNE SYSTEMS - YOU SHOULD BE VACCINATED TO HELP PREVENT YOURSELF FROM GETTING SICK, BUT ALSO TO PREVENT SOMEONE ELSE FROM GETTING AN INFECTION WHICH MAY HOSPITALIZE OR KILL THEM.  GOOD NUTRITION AND HEALTHY LIFESTYLE WILL HELP YOUR IMMUNE SYSTEM YEAR-ROUND! THERE IS NO MAGIC SUPPLEMENT!  AND ABOVE ALL - WASH YOUR HANDS OFTEN!

## 2016-03-16 ENCOUNTER — Telehealth: Payer: Self-pay | Admitting: Family Medicine

## 2016-03-16 NOTE — Telephone Encounter (Signed)
Patient called into the office and left a message on my voicemail to call her about a visit she had.  I called the patient on 03/08/16 & 03/16/16, I'm not able to leave a message because of the voicemail not being set up.

## 2017-11-04 IMAGING — DX DG TIBIA/FIBULA 2V*L*
4 series · 4 of 4 positions shown · non-contrast
Comparison: None.

CLINICAL DATA: Status post fall with bruising of the lower leg.

EXAM:
LEFT TIBIA AND FIBULA - 2 VIEW

[tibia ap (1 of 2)]
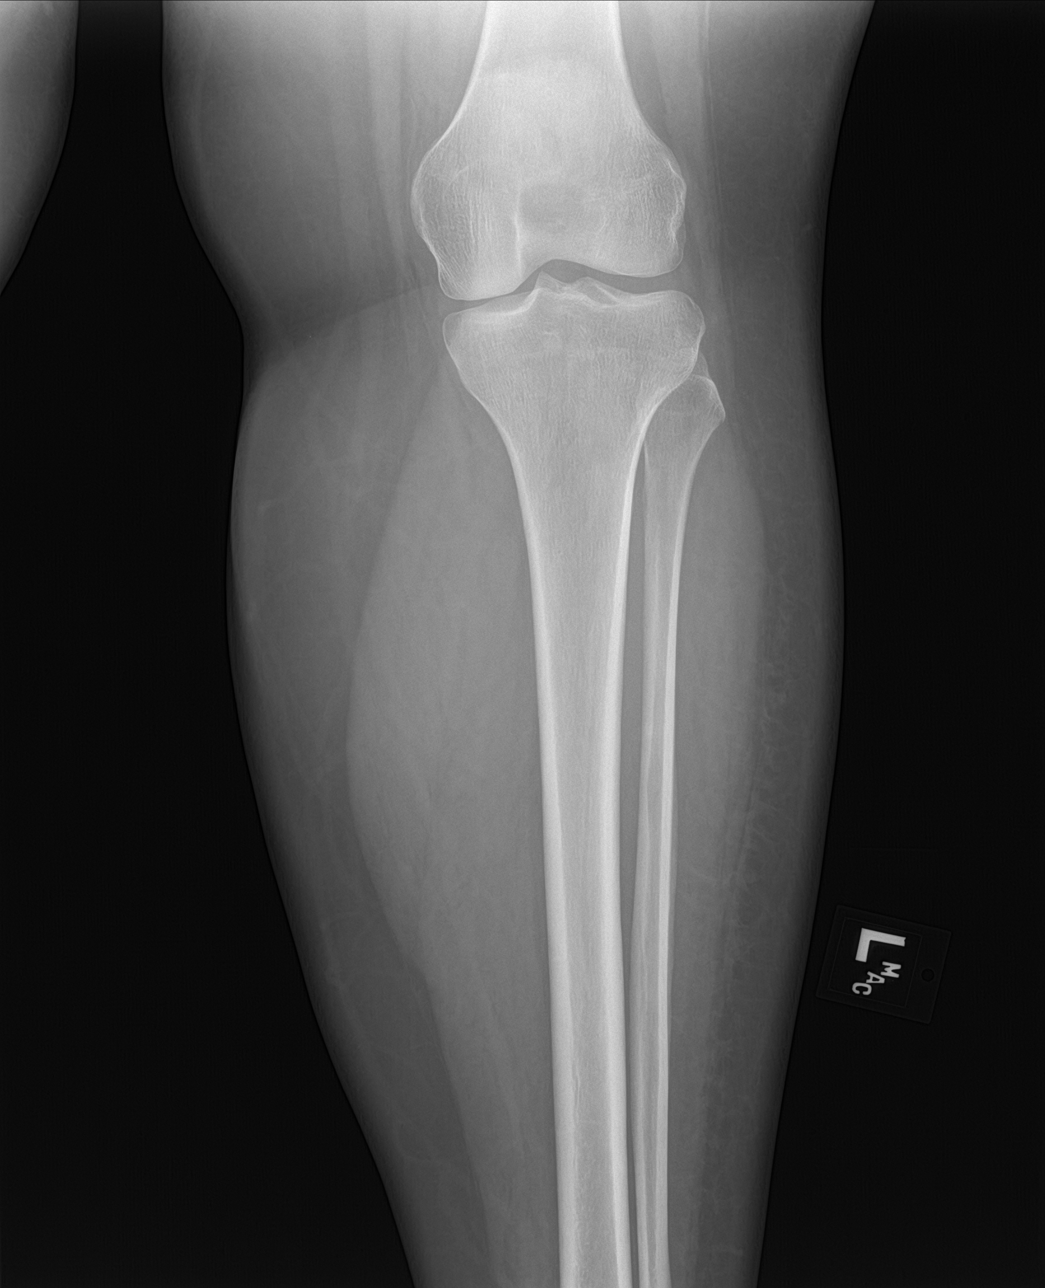

[tibia ap (2 of 2)]
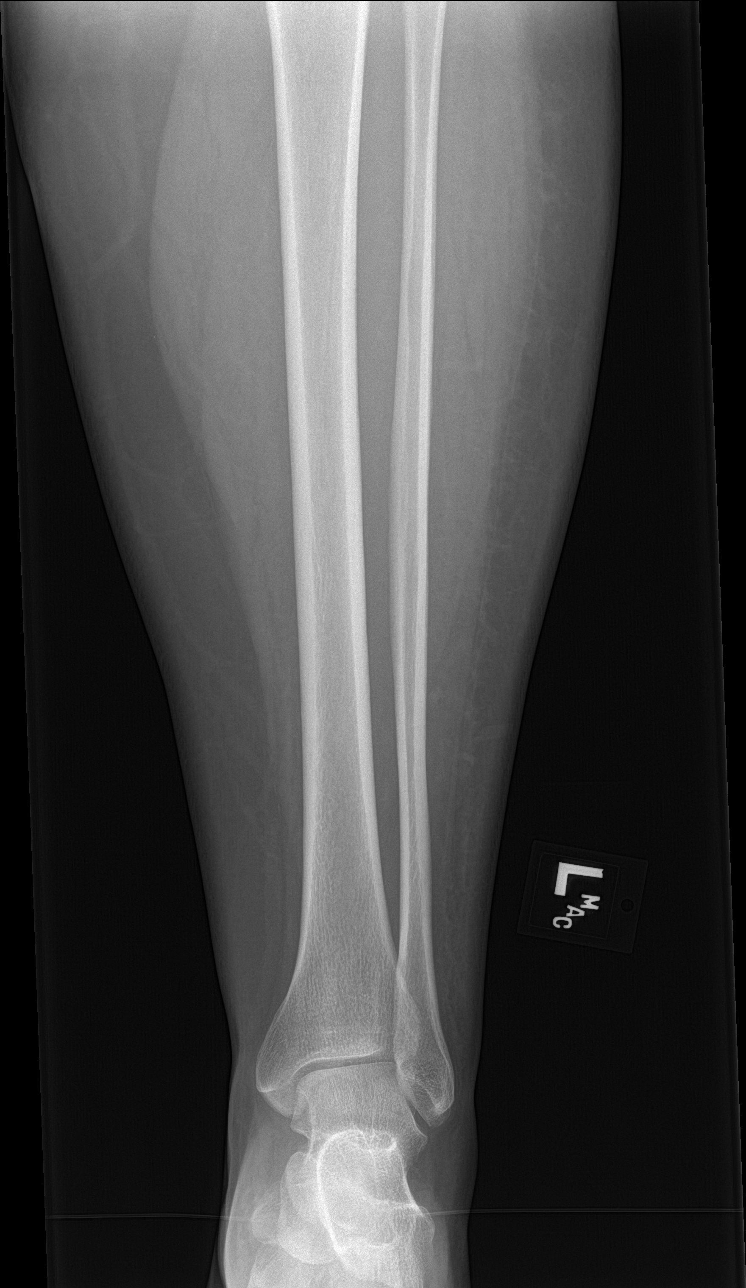

[tibia lat (1 of 2)]
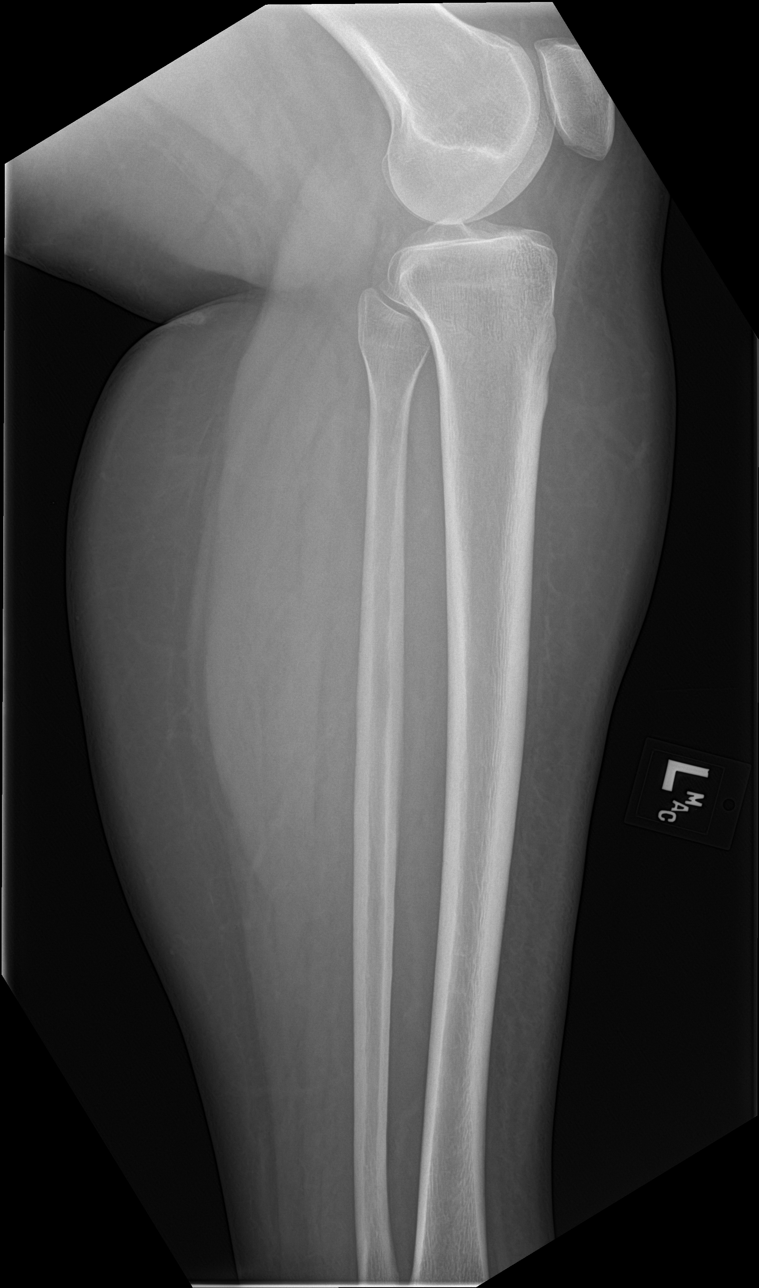

[tibia lat (2 of 2)]
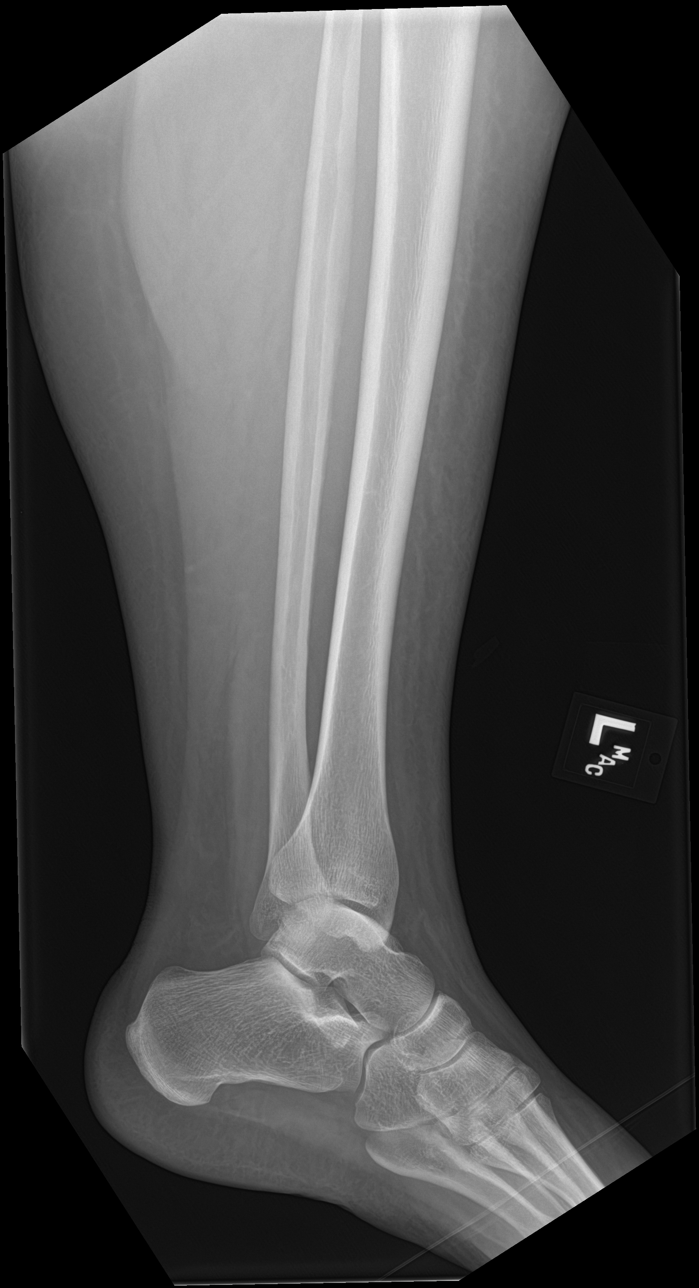

[4 of 4 positions shown; findings below may reference images not displayed]

FINDINGS: There is no evidence of fracture or other focal bone lesions. Soft
tissues are unremarkable.
IMPRESSION: Negative.
